# Patient Record
Sex: Male | Born: 1955
Health system: Southern US, Community
[De-identification: ages and names within clinical notes are randomized; demographics above are authoritative.]

## PROBLEM LIST (undated history)

## (undated) DIAGNOSIS — N400 Enlarged prostate without lower urinary tract symptoms: Secondary | ICD-10-CM

## (undated) DIAGNOSIS — M47812 Spondylosis without myelopathy or radiculopathy, cervical region: Secondary | ICD-10-CM

## (undated) DIAGNOSIS — M48061 Spinal stenosis, lumbar region without neurogenic claudication: Secondary | ICD-10-CM

## (undated) DIAGNOSIS — R339 Retention of urine, unspecified: Secondary | ICD-10-CM

## (undated) DIAGNOSIS — R351 Nocturia: Secondary | ICD-10-CM

## (undated) DIAGNOSIS — M1990 Unspecified osteoarthritis, unspecified site: Secondary | ICD-10-CM

## (undated) HISTORY — PX: INGUINAL HERNIA REPAIR: SUR1180

## (undated) HISTORY — PX: OTHER SURGICAL HISTORY: SHX169

## (undated) HISTORY — DX: Unspecified osteoarthritis, unspecified site: M19.90

---

## 1998-12-01 ENCOUNTER — Encounter: Admission: RE | Admit: 1998-12-01 | Discharge: 1998-12-01 | Payer: Self-pay | Admitting: Sports Medicine

## 2000-01-28 ENCOUNTER — Ambulatory Visit (HOSPITAL_COMMUNITY): Admission: RE | Admit: 2000-01-28 | Discharge: 2000-01-28 | Payer: Self-pay | Admitting: Gastroenterology

## 2001-08-15 ENCOUNTER — Encounter: Admission: RE | Admit: 2001-08-15 | Discharge: 2001-08-15 | Payer: Self-pay | Admitting: Family Medicine

## 2003-08-09 ENCOUNTER — Ambulatory Visit (HOSPITAL_COMMUNITY): Admission: RE | Admit: 2003-08-09 | Discharge: 2003-08-09 | Payer: Self-pay | Admitting: Gastroenterology

## 2004-05-29 ENCOUNTER — Encounter: Admission: RE | Admit: 2004-05-29 | Discharge: 2004-05-29 | Payer: Self-pay | Admitting: Family Medicine

## 2004-06-02 ENCOUNTER — Encounter: Admission: RE | Admit: 2004-06-02 | Discharge: 2004-06-02 | Payer: Self-pay | Admitting: Sports Medicine

## 2004-06-03 ENCOUNTER — Encounter: Admission: RE | Admit: 2004-06-03 | Discharge: 2004-06-03 | Payer: Self-pay | Admitting: Sports Medicine

## 2004-06-05 ENCOUNTER — Encounter: Admission: RE | Admit: 2004-06-05 | Discharge: 2004-06-05 | Payer: Self-pay | Admitting: Family Medicine

## 2004-06-12 ENCOUNTER — Ambulatory Visit: Payer: Self-pay | Admitting: Sports Medicine

## 2004-06-19 ENCOUNTER — Ambulatory Visit: Payer: Self-pay | Admitting: Sports Medicine

## 2004-07-03 ENCOUNTER — Ambulatory Visit: Payer: Self-pay | Admitting: Sports Medicine

## 2004-07-21 ENCOUNTER — Encounter: Admission: RE | Admit: 2004-07-21 | Discharge: 2004-07-21 | Payer: Self-pay | Admitting: Sports Medicine

## 2004-07-21 ENCOUNTER — Ambulatory Visit: Payer: Self-pay | Admitting: Sports Medicine

## 2004-07-28 ENCOUNTER — Ambulatory Visit: Payer: Self-pay | Admitting: Family Medicine

## 2005-11-09 ENCOUNTER — Ambulatory Visit: Payer: Self-pay | Admitting: Sports Medicine

## 2005-11-11 ENCOUNTER — Encounter: Admission: RE | Admit: 2005-11-11 | Discharge: 2005-11-11 | Payer: Self-pay | Admitting: Sports Medicine

## 2006-01-25 ENCOUNTER — Emergency Department (HOSPITAL_COMMUNITY): Admission: EM | Admit: 2006-01-25 | Discharge: 2006-01-25 | Payer: Self-pay | Admitting: Family Medicine

## 2006-03-08 ENCOUNTER — Ambulatory Visit: Payer: Self-pay | Admitting: Sports Medicine

## 2006-03-10 ENCOUNTER — Encounter: Admission: RE | Admit: 2006-03-10 | Discharge: 2006-03-10 | Payer: Self-pay | Admitting: Sports Medicine

## 2006-06-24 ENCOUNTER — Ambulatory Visit: Payer: Self-pay | Admitting: Sports Medicine

## 2006-11-02 ENCOUNTER — Ambulatory Visit: Payer: Self-pay | Admitting: Sports Medicine

## 2006-12-16 ENCOUNTER — Ambulatory Visit: Payer: Self-pay | Admitting: Sports Medicine

## 2006-12-16 DIAGNOSIS — M5136 Other intervertebral disc degeneration, lumbar region: Secondary | ICD-10-CM | POA: Insufficient documentation

## 2006-12-16 DIAGNOSIS — M549 Dorsalgia, unspecified: Secondary | ICD-10-CM | POA: Insufficient documentation

## 2006-12-16 DIAGNOSIS — M5126 Other intervertebral disc displacement, lumbar region: Secondary | ICD-10-CM

## 2007-01-20 ENCOUNTER — Ambulatory Visit: Payer: Self-pay | Admitting: Sports Medicine

## 2007-01-20 DIAGNOSIS — S93439A Sprain of tibiofibular ligament of unspecified ankle, initial encounter: Secondary | ICD-10-CM | POA: Insufficient documentation

## 2007-02-21 ENCOUNTER — Ambulatory Visit: Payer: Self-pay | Admitting: Sports Medicine

## 2007-03-21 ENCOUNTER — Ambulatory Visit: Payer: Self-pay | Admitting: Sports Medicine

## 2007-03-24 ENCOUNTER — Encounter: Admission: RE | Admit: 2007-03-24 | Discharge: 2007-03-24 | Payer: Self-pay | Admitting: Sports Medicine

## 2007-03-24 ENCOUNTER — Encounter (INDEPENDENT_AMBULATORY_CARE_PROVIDER_SITE_OTHER): Payer: Self-pay | Admitting: *Deleted

## 2007-04-03 ENCOUNTER — Encounter: Payer: Self-pay | Admitting: Sports Medicine

## 2007-04-10 ENCOUNTER — Encounter (INDEPENDENT_AMBULATORY_CARE_PROVIDER_SITE_OTHER): Payer: Self-pay | Admitting: *Deleted

## 2007-05-17 ENCOUNTER — Encounter: Payer: Self-pay | Admitting: Sports Medicine

## 2007-06-28 ENCOUNTER — Encounter (INDEPENDENT_AMBULATORY_CARE_PROVIDER_SITE_OTHER): Payer: Self-pay | Admitting: *Deleted

## 2007-12-05 ENCOUNTER — Encounter (INDEPENDENT_AMBULATORY_CARE_PROVIDER_SITE_OTHER): Payer: Self-pay | Admitting: *Deleted

## 2008-10-29 ENCOUNTER — Ambulatory Visit: Payer: Self-pay | Admitting: Sports Medicine

## 2008-10-29 DIAGNOSIS — M25519 Pain in unspecified shoulder: Secondary | ICD-10-CM | POA: Insufficient documentation

## 2008-10-29 DIAGNOSIS — M25569 Pain in unspecified knee: Secondary | ICD-10-CM | POA: Insufficient documentation

## 2009-03-19 ENCOUNTER — Ambulatory Visit: Payer: Self-pay | Admitting: Sports Medicine

## 2009-03-19 DIAGNOSIS — IMO0002 Reserved for concepts with insufficient information to code with codable children: Secondary | ICD-10-CM | POA: Insufficient documentation

## 2010-12-30 ENCOUNTER — Other Ambulatory Visit: Payer: Self-pay | Admitting: Sports Medicine

## 2010-12-30 DIAGNOSIS — M549 Dorsalgia, unspecified: Secondary | ICD-10-CM

## 2011-02-23 ENCOUNTER — Other Ambulatory Visit: Payer: Self-pay | Admitting: Sports Medicine

## 2011-04-17 ENCOUNTER — Other Ambulatory Visit: Payer: Self-pay | Admitting: Sports Medicine

## 2011-05-13 ENCOUNTER — Ambulatory Visit (INDEPENDENT_AMBULATORY_CARE_PROVIDER_SITE_OTHER): Payer: 59 | Admitting: Sports Medicine

## 2011-05-13 ENCOUNTER — Encounter: Payer: Self-pay | Admitting: Sports Medicine

## 2011-05-13 VITALS — BP 115/71 | HR 42 | Ht 67.0 in | Wt 150.0 lb

## 2011-05-13 DIAGNOSIS — M5137 Other intervertebral disc degeneration, lumbosacral region: Secondary | ICD-10-CM

## 2011-05-13 DIAGNOSIS — S76319A Strain of muscle, fascia and tendon of the posterior muscle group at thigh level, unspecified thigh, initial encounter: Secondary | ICD-10-CM | POA: Insufficient documentation

## 2011-05-13 DIAGNOSIS — IMO0002 Reserved for concepts with insufficient information to code with codable children: Secondary | ICD-10-CM

## 2011-05-13 MED ORDER — GABAPENTIN 600 MG PO TABS: 600.0000 mg | ORAL_TABLET | Freq: Two times a day (BID) | ORAL | Status: DC

## 2011-05-13 NOTE — Assessment & Plan Note (Signed)
History seems to be good so I think he should continue to wear a compression sleeve. He can continue to apply but be careful with any sprinting.  A series of hamstring exercises and also some for his hips which are very tight to try to prevent this from turning into more significant injury.

## 2011-05-13 NOTE — Patient Instructions (Signed)
Do pretzel stretch 2-3 times for 30 seconds daily

## 2011-05-13 NOTE — Assessment & Plan Note (Signed)
He has very limited mobility of his lumbosacral spine. Based on the degree of degenerative change in his lumbar MRI I suspect he will need to continue taking the gabapentin. He can adjust the dose if he is doing well but can take from 600 daily up to 3 times a day if needed  Recheck this with me yearly.

## 2011-05-13 NOTE — Progress Notes (Signed)
  Subjective:    Patient ID: George Ramos, male    DOB: 03/28/1956, 55 y.o.   MRN: [id number]  HPI  Pt presents to clinic today for f/u for gabapentin which he takes for ruptures disc.  States he stopped the medication 1-2 years ago, and the back pain returned, restarted the med and pain subsided.  Currently taking gabapentin 600 mg at bedtime. States his back ROM has improved, and he is able to sleep well while on the gabapentin.  Plays tennis several times per week.  Takes 600 mg ibuprofen before playing tennis.   Has Lt HS pain for 4-6 weeks that he felt during a tennis match.  Took 1 week off of tennis, but continues to have HS pain when he plays tennis.    Review of Systems     Objective:   Physical Exam  20 degrees back extension and is more comfortable with bending knees Forward flexion gets 80 degrees Lateral bend- all out of thoracic spine bilaterally- lumbar spine does not move Rotation bilaterally slightly diminished but improved 70 degrees right straight leg without problems 55 degrees lt straight leg w/out problems FABER normal on rt, but slightly tight FABER tight on lt- 10-15 deg limitation Lt SI joint slightly tight to movement  Good HS strength with foot IR, ER and neutral        Assessment & Plan:

## 2011-09-15 ENCOUNTER — Ambulatory Visit (INDEPENDENT_AMBULATORY_CARE_PROVIDER_SITE_OTHER): Payer: 59 | Admitting: Sports Medicine

## 2011-09-15 ENCOUNTER — Ambulatory Visit
Admission: RE | Admit: 2011-09-15 | Discharge: 2011-09-15 | Disposition: A | Discharge status: Home / Self Care | Payer: 59 | Source: Ambulatory Visit | Attending: Sports Medicine | Admitting: Sports Medicine

## 2011-09-15 VITALS — BP 110/60

## 2011-09-15 DIAGNOSIS — M25519 Pain in unspecified shoulder: Secondary | ICD-10-CM

## 2011-09-15 DIAGNOSIS — M5137 Other intervertebral disc degeneration, lumbosacral region: Secondary | ICD-10-CM

## 2011-09-15 DIAGNOSIS — M542 Cervicalgia: Secondary | ICD-10-CM

## 2011-09-15 MED ORDER — KETOPROFEN POWD: Status: DC

## 2011-09-15 MED ORDER — TRAMADOL HCL 50 MG PO TABS: 50.0000 mg | ORAL_TABLET | Freq: Two times a day (BID) | ORAL | Status: AC | PRN

## 2011-09-15 NOTE — Assessment & Plan Note (Signed)
I am concerned that this might be coming from his neck with some nerve root impingement Start some tramadol for pain relief Continue the gabapentin  Check cervical spine x-rays  If Cervical spine does not look to be the problem we should bring him back in 2 scan his rotator cuff

## 2011-09-15 NOTE — Assessment & Plan Note (Signed)
Continue on gabapentin as this seems to be helping his symptoms He has not been using any tramadol and seems to get enough relief with ibuprofen and Tylenol for low back

## 2011-09-15 NOTE — Patient Instructions (Addendum)
Please go to [facility] for your neck x-ray  Do easy range of motion for your neck a couple of times per day  Do suggested exercises for your shoulder daily, and shake out exercises  Try topical ketoprofen gel on your rt index finger 3-4 times per day  Try tramadol at dinner time, and you may take another one before bed if you are having pain  Please follow up in 6 weeks   Thank you for seeing [place] today!

## 2011-09-15 NOTE — Progress Notes (Signed)
  Subjective:    Patient ID: George Ramos, male    DOB: [birth year], 55 y.o.   MRN: [id number]  HPI  Pt presents to clinic for Rt shoulder pain. Does not recall a specific injury.  Wakes him at night especially with IR positions. Does a lot of energy assessments for work, crawling under houses, putting shoulder in painful positions.  Has shoulder pain after these assessments.  Now unable to serve in tennis without pain. Takes gabapentin 600 mg qhs for low back, which has been helpful.  Has old [employer] fx.  He played tennis term is through October without a lot of shoulder pain. He is actually been resting more the past 6 weeks and has been an increase in the shoulder symptom.  Has right 2nd finger tenderness that has been improving over the past 2 months.    Review of Systems     Objective:   Physical Exam No acute distress  Neck Full extension, flexion, full rt and lt lateral rotation but feels some discomfort  Lt lateral bend 30 deg, 15 deg rt  Rt shoulder sits slight high, arm slightly longer Back is not asymmetric   Rt shoulder exam: Has pain at 120 deg elevation on rt Slight scapular asynchrony Bicipital grooves non tender Speeds causes slight discomfort on rt Abduction at waist level no pain Elevation 120 deg strong Empty can neg Resisted IR and ER strong at waist level Hawkins slight painful, but not weak Neer's is painful Cross over slightly painful O'brein's painful, but not with thumb down ER at 90 deg not painful Push off test neg  Rt 2nd finger- arthritic change at MCP         Assessment & Plan:

## 2011-09-16 ENCOUNTER — Telehealth: Payer: Self-pay | Admitting: *Deleted

## 2011-09-16 NOTE — Telephone Encounter (Signed)
Spoke with pt- advised per dr. Darrick Penna his c-spine x-ray showed some arthritis, and he thinks this is the cause of his shoulder pain due to a nerve impingement.  Dr. Darrick Penna recommends taking tramadol for pain, if this does not control symptoms- increase gabapentin to 1/2 tab in the morning and 1 tab qhs of the 600 mg tablets.  Pt agreeable.

## 2011-10-27 ENCOUNTER — Ambulatory Visit (INDEPENDENT_AMBULATORY_CARE_PROVIDER_SITE_OTHER): Payer: BC Managed Care – PPO | Admitting: Sports Medicine

## 2011-10-27 VITALS — BP 122/64

## 2011-10-27 DIAGNOSIS — M25519 Pain in unspecified shoulder: Secondary | ICD-10-CM

## 2011-10-27 DIAGNOSIS — M47812 Spondylosis without myelopathy or radiculopathy, cervical region: Secondary | ICD-10-CM

## 2011-10-27 NOTE — Assessment & Plan Note (Addendum)
Evident on neck films from 09/15/11.  Probably primary etiology of shoulder pain.   We will probably need to continue long term gabapentin and tramadol  Reck in 2 mos pending response

## 2011-10-27 NOTE — Patient Instructions (Addendum)
When you can tolerate the gabapentin at your current dose, increase to one tablet in the morning and one in the evening.  Continue tramadol, one in morning one in the evening.   Please start doing the following exercises (see attached sheet) one daily, 3 sets of 15 with a light weight.    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

## 2011-10-27 NOTE — Progress Notes (Signed)
  Subjective:    Patient ID: George Ramos, male    DOB: [birth year], 56 y.o.   MRN: [id number]  HPI  Babe comes in for followup of his right neck and shoulder pain. He says he is sleeping a little bit better, but his shoulder still bothers him quite a bit at night when he tries to sleep on his right side. He says he has not played much tennis since. October.  He tried doing home exercises, but felt like they were pinching and irritating something in his shoulder, so he stopped doing them.   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    He is taking gabapentin 300 in the morning and 600 in the evening, and tramadol 50 twice a day. This regimen did end the day pain and would be tolerable level x for tennis.  Night pain is almost always positional.  Review of Systems Pertinent items noted in HPI.     Objective:   Physical Exam BP 122/64 General appearance: alert, cooperative and no distress MSK: Neck: patient has decreased ROM looking laterally, R>L Decreased ROM with shoulder to ear, R>L  Shoulder: No deformity or tenderness to palpation bilaterally Right shoulder with slight pain with hawkins Normal strength with rotator cuff testing.   Back: Pt has winging of right scapula compared to left, at rest right scapula sits higher and it wings out with ROM.   MSK [place] of Right Shoulder:  AC joint appears normal with no evidence of arthritic changes Biceps tendon in tact, no abnormalities Infraspinatus, supraspinatus, and teres minor in tact without abnormalities. Subscapularis is intact but there is some hypoechogenic areas inferior surface indicative of swelling.  No tears noted      Assessment & Plan:

## 2011-10-27 NOTE — Assessment & Plan Note (Signed)
Patient has asymetry and winging of right scapula compared to left, with no obvious rotator cuff injury.  Concerning for nerve root impingement (particularly considering x-rays that show spurring in the c6-7 spaces). Will increase gabapentin and give home exercise program aimed at correcting winging of scapula.  See pt instructions.

## 2011-11-30 ENCOUNTER — Other Ambulatory Visit: Payer: Self-pay | Admitting: Gastroenterology

## 2011-12-27 ENCOUNTER — Ambulatory Visit (INDEPENDENT_AMBULATORY_CARE_PROVIDER_SITE_OTHER): Payer: BC Managed Care – PPO | Admitting: Sports Medicine

## 2011-12-27 VITALS — BP 120/60

## 2011-12-27 DIAGNOSIS — M25519 Pain in unspecified shoulder: Secondary | ICD-10-CM

## 2011-12-27 MED ORDER — GABAPENTIN 600 MG PO TABS: ORAL_TABLET | ORAL | Status: DC

## 2011-12-27 MED ORDER — TRAMADOL HCL 50 MG PO TABS: 50.0000 mg | ORAL_TABLET | Freq: Four times a day (QID) | ORAL | Status: AC | PRN

## 2011-12-27 NOTE — Assessment & Plan Note (Signed)
His pain is improved with HEP, gabapentin, and tramadol.  We will continue his HEP and start to wean the gabapentin.  We will hope to continue to wean his meds in upcoming visits.

## 2011-12-27 NOTE — Patient Instructions (Signed)
1. Take one gabapentin at night and a half one in the daytime.   Continue taking your twice daily tramadol.   2. Follow up in 3 months or sooner for problems.

## 2011-12-27 NOTE — Progress Notes (Signed)
  Subjective:    Patient ID: George Ramos, male    DOB: Feb 01, 1956, 56 y.o.   MRN: [id number]  HPI 56 y/o male is here for follow up for right shoulder pain.  He only had the pain with his tennis game and while at work.  He continues to have the pain occasionally, especially with his serve but it is much less in intensity.  He is taking gabapentin 600mg , one tab in the morning and one at night.  He is also taking 2 doses of tramadol daily, one before activity and one at night.  He has no neck pain at all.   Review of Systems     Objective:   Physical Exam  Right Shoulder: Inspection reveals no abnormalities or atrophy.  The right side is somewhat more developed than the left but there is no gross asymmetry. Palpation is normal with no tenderness over AC joint or bicipital groove. ROM is essentially full in all planes.   Rotator cuff strength normal throughout. No painful arc and no drop arm sign. No signs of impingement with negative Neer and Hawkin's tests, empty can. Speeds and Yergason's tests normal. No labral pathology noted with negative Obrien's. Right scapula has some degree of winging after 4 arm raises but much less than on previous exam.  Neck: Full range of motion         Assessment & Plan:

## 2012-03-13 ENCOUNTER — Other Ambulatory Visit: Payer: Self-pay | Admitting: *Deleted

## 2012-03-13 MED ORDER — TRAMADOL HCL 50 MG PO TABS: 50.0000 mg | ORAL_TABLET | Freq: Four times a day (QID) | ORAL | Status: AC | PRN

## 2012-03-28 ENCOUNTER — Encounter: Payer: Self-pay | Admitting: Sports Medicine

## 2012-03-28 ENCOUNTER — Ambulatory Visit (INDEPENDENT_AMBULATORY_CARE_PROVIDER_SITE_OTHER): Payer: BC Managed Care – PPO | Admitting: Sports Medicine

## 2012-03-28 VITALS — BP 116/73 | HR 51 | Ht 67.0 in | Wt 150.0 lb

## 2012-03-28 DIAGNOSIS — M79609 Pain in unspecified limb: Secondary | ICD-10-CM

## 2012-03-28 DIAGNOSIS — M79671 Pain in right foot: Secondary | ICD-10-CM | POA: Insufficient documentation

## 2012-03-28 DIAGNOSIS — M47812 Spondylosis without myelopathy or radiculopathy, cervical region: Secondary | ICD-10-CM

## 2012-03-28 NOTE — Assessment & Plan Note (Addendum)
Most likely metatarsal pain 2/2 transverse arch collapse. Pt given green OTC orthotic with metatarsal pad for support of transverse arch to see if this helps with discomfort.  Pt to return as needed.   He was able to walk with the sports insole and the metatarsal pads with less pain. We will test this for one month and then have him continue if working.

## 2012-03-28 NOTE — Assessment & Plan Note (Addendum)
Pt to continue rehab exercises.  Continue gabapentin and tramadol as directed.  Return as needed for recheck or if new or worsening of symptoms.   He will progresses tennis activities and continue to maintain his shoulder strength

## 2012-03-28 NOTE — Progress Notes (Signed)
  Subjective:    Patient ID: George Ramos, male    DOB: Oct 18, 1955, 56 y.o.   MRN: [id number]  HPI Right foot pain: Daily x 1-2 months, pain in bottom front of foot and base of 3rd and 4th toes.  Wearing works boots makes it worse.  Playing tennis, walking barefoot makes it worse.  Wearing tennis shoes makes it better.  No redness of foot. No rash.   Right shoulder/neck pain f/up: 75% improved compared to last visit. Pain in neck/shoulder thought to be 2/2 cervical spine degeneration. Now Often painfree.  Pain sometimes occurs at night- pain just "annoying"- pain of 2/10 on painscale.  Previously was 7/10.   Pain very positional at night.  Pain well controlled with neurotin 900 at bedtimes.  And occasional neurotin 600mg  at noon.  Takes tramadol most days at noon.  No numbness/tingling in arm.  No pain in neck and shoulder at time of exam.  No weakness in arms.      Review of Systems    as per above Objective:   Physical Exam  Constitutional: He appears well-developed.  Cardiovascular: Normal rate.   Pulmonary/Chest: Effort normal. No respiratory distress.  Musculoskeletal:       Right Shoulder: Inspection reveals no abnormalities or atrophy.  no gross asymmetry. Palpation is normal with no tenderness over AC joint or bicipital groove. ROM is  full in all planes.   Rotator cuff strength normal throughout. No painful arc and no drop arm sign. No signs of impingement with negative Neer and Hawkin's tests, empty can. No winging of scapula as seen on previous exam  Neck: Full range of motion. No tenderness to palpation.    Right foot: + transverse arch collapse.  + point tenderness on plantar surface of foot at base of 3rd and 4th toes.            Assessment & Plan:

## 2012-05-22 ENCOUNTER — Other Ambulatory Visit: Payer: Self-pay | Admitting: *Deleted

## 2012-05-22 MED ORDER — GABAPENTIN 600 MG PO TABS: ORAL_TABLET | ORAL | Status: DC

## 2012-07-13 ENCOUNTER — Encounter: Payer: Self-pay | Admitting: Sports Medicine

## 2012-07-13 ENCOUNTER — Ambulatory Visit (INDEPENDENT_AMBULATORY_CARE_PROVIDER_SITE_OTHER): Payer: BC Managed Care – PPO | Admitting: Sports Medicine

## 2012-07-13 VITALS — BP 114/67 | HR 56 | Ht 67.0 in | Wt 150.0 lb

## 2012-07-13 DIAGNOSIS — R269 Unspecified abnormalities of gait and mobility: Secondary | ICD-10-CM

## 2012-07-13 DIAGNOSIS — M79609 Pain in unspecified limb: Secondary | ICD-10-CM

## 2012-07-13 DIAGNOSIS — M79671 Pain in right foot: Secondary | ICD-10-CM

## 2012-07-13 NOTE — Assessment & Plan Note (Signed)
Patient's running gait was more comfortable and less limp is present osseous place and orthotics  He will tennis these over the next month and we'll make any adjustments before he plays tournaments  If doing well we will recheck when necessary

## 2012-07-13 NOTE — Assessment & Plan Note (Addendum)
Patient was fitted for a : standard, cushioned, semi-rigid orthotic. The orthotic was heated and afterward the patient stood on the orthotic blank positioned on the orthotic stand. The patient was positioned in subtalar neutral position and 10 degrees of ankle dorsiflexion in a weight bearing stance. After completion of molding, a stable base was applied to the orthotic blank. The blank was ground to a stable position for weight bearing. Size: 11 Base: Red Posting: Blue EVA Additional orthotic padding: small MT pad on the rt orthotic with 1st ray post with blue foam  Patient was comfortable and walking gait neutral with orthotics.  Preparation time is 40 minutes

## 2012-07-13 NOTE — Progress Notes (Signed)
Patient ID: George Ramos, male   DOB: [birth year], 55 y.o.   MRN: [id number]  Patient is a competitive Armed forces operational officer. We placed him in metatarsal pads for chronic right foot pain. A metatarsal pad did allow him to return to playing tennis. However the metatarsal pads feel uncomfortable in regular shoes. He is starting to play more tennis and is wearing through his sports insoles. He comes to consider custom orthotics as he wants to play in the state tournaments and feels that in his regular tennis shoes and sports insoles he gets too much pain to be able to complete a tournament.  Physical examination The right foot shows a drop in the transverse arch over the metatarsals 3 and 4 Metatarsal head 3 is moderately tender and metatarsal head for is mildly tender Metatarsals one 2 and 5 her nontender He still has a moderately preserved longitudinal arch  Running gait reveals a foer foot strike with some external rotation of the feet bilaterally

## 2012-11-29 ENCOUNTER — Ambulatory Visit: Payer: BC Managed Care – PPO | Admitting: Sports Medicine

## 2012-12-19 ENCOUNTER — Ambulatory Visit (INDEPENDENT_AMBULATORY_CARE_PROVIDER_SITE_OTHER): Payer: BC Managed Care – PPO | Admitting: Sports Medicine

## 2012-12-19 ENCOUNTER — Encounter: Payer: Self-pay | Admitting: Sports Medicine

## 2012-12-19 VITALS — BP 130/70 | Ht 67.0 in | Wt 150.0 lb

## 2012-12-19 DIAGNOSIS — M7710 Lateral epicondylitis, unspecified elbow: Secondary | ICD-10-CM

## 2012-12-19 DIAGNOSIS — M7711 Lateral epicondylitis, right elbow: Secondary | ICD-10-CM | POA: Insufficient documentation

## 2012-12-19 MED ORDER — NITROGLYCERIN 0.2 MG/HR TD PT24: MEDICATED_PATCH | TRANSDERMAL | Status: DC

## 2012-12-19 NOTE — Assessment & Plan Note (Signed)
See plan in progress note  Good candidate for home rehab and NTG protocol

## 2012-12-19 NOTE — Progress Notes (Signed)
Patient ID: George Ramos, male   DOB: [birth year], 56 y.o.   MRN: [id number]  Follow up for    Subjective:  George Ramos is a R hand dominant 57 year old Caucasian male presenting to clinic for 7 month history of R lateral epicondyle pain that began when playing tennis, reaching his forehand back to hit a ball.  Managed pain initially with ice and decreasing tennis playing (once a week) and then received corticosteroid shot 6 months ago (October) however only lasted 1.5 weeks and was unable to compete in state tennis tournament due to pain.  Decreased tennis further over the last 2 months, attempted exercises (wrist flexion/extension and supination/pronation) that aggravated with return of pain.  Attempted to play tennis 2 weeks ago and continues to have pain.  Pain seems to worsen especially with supination (pulling covers off, using a screwdriver) and hitting a tennis ball with backhand. Taking Gabapentin 600 mg at bedtime for back pain and occasional Ibuprofen for pain with no relief. History of similar issues about 2-3 years ago that improved with rest.  No history of migraines.  Denies weakness, numbness, tingling in fingers.   Objective: BP 130/70  Ht 5\' 7"  (1.702 m)  Wt 150 lb (68.04 kg)  BMI 23.49 kg/m2 GEN:  Well appearing, fit elderly man sitting on examining table in no acute distress.   MSK: R elbow and forearm:  No obvious deformities or swelling.  Point tenderness to R lateral epicondyle with active contraction of forearm muscles, non tender when relaxed.  Has full passive and active ROM of R elbow and wrist, flexion/extension and supination/pronation.  5/5 strength with elbow flexion/extension and supination/pronation with localized mild tenderness at R lateral epicondyle. 2+ radial pulses bilaterally.  Full sensation to forearm.   Book test +  U/S with Doppler of R lateral epicondyle: 2 bone spurs at lat epicondyle seen with splitting of tendon.  Increased vascularity seen  directly overlaying bone spur as well as along the extensor muscle insertion at the lat epicondyle.  Fluid collection also along the muscle bed.  Large amount of neovascular activity in main belly of tendon.  Assessment and Plan: George Ramos is a 57 year old R hand dominant male presenting for management for R lateral epicondylitis.  U/S significant for bone spurs, increased vascularity, and edema likely evidence of increased inflammation due epicondylitis.  Would likely benefit from nitroglycerin protocol.     1. Will start on regimen including compressive elbow sleeve, exercises (ball squeeze, wrist extension, and elbow supination) targeting extensor muscle strengthening, and nitroglycerin 1/4 patch daily for 6 weeks. 2. Encouraged to restart tennis playing however limiting mostly to drills (no competitive play) and watching backhand use. 3. Will follow up in 6 weeks.   Walden Field, MD [facility] PGY-1 12/19/2012 10:07 AM

## 2013-08-05 ENCOUNTER — Other Ambulatory Visit: Payer: Self-pay | Admitting: Sports Medicine

## 2013-08-21 ENCOUNTER — Observation Stay (HOSPITAL_COMMUNITY)
Admission: EM | Admit: 2013-08-21 | Discharge: 2013-08-22 | Disposition: A | Discharge status: Home / Self Care | Payer: BC Managed Care – PPO | Attending: Urology | Admitting: Urology

## 2013-08-21 ENCOUNTER — Encounter (HOSPITAL_COMMUNITY): Payer: Self-pay | Admitting: Emergency Medicine

## 2013-08-21 ENCOUNTER — Emergency Department (HOSPITAL_COMMUNITY): Payer: BC Managed Care – PPO

## 2013-08-21 DIAGNOSIS — K921 Melena: Secondary | ICD-10-CM | POA: Insufficient documentation

## 2013-08-21 DIAGNOSIS — N400 Enlarged prostate without lower urinary tract symptoms: Secondary | ICD-10-CM | POA: Insufficient documentation

## 2013-08-21 DIAGNOSIS — K625 Hemorrhage of anus and rectum: Secondary | ICD-10-CM

## 2013-08-21 DIAGNOSIS — W1811XA Fall from or off toilet without subsequent striking against object, initial encounter: Secondary | ICD-10-CM | POA: Insufficient documentation

## 2013-08-21 DIAGNOSIS — I959 Hypotension, unspecified: Secondary | ICD-10-CM | POA: Insufficient documentation

## 2013-08-21 DIAGNOSIS — S0003XA Contusion of scalp, initial encounter: Secondary | ICD-10-CM | POA: Insufficient documentation

## 2013-08-21 DIAGNOSIS — R55 Syncope and collapse: Principal | ICD-10-CM | POA: Insufficient documentation

## 2013-08-21 DIAGNOSIS — M47812 Spondylosis without myelopathy or radiculopathy, cervical region: Secondary | ICD-10-CM | POA: Insufficient documentation

## 2013-08-21 LAB — PROTIME-INR
INR: 1.14 (ref 0.00–1.49)
Prothrombin Time: 14.4 seconds (ref 11.6–15.2)

## 2013-08-21 LAB — CBC
HCT: 38.2 % — ABNORMAL LOW (ref 39.0–52.0)
Hemoglobin: 13.4 g/dL (ref 13.0–17.0)
MCH: 29.8 pg (ref 26.0–34.0)
MCHC: 35.1 g/dL (ref 30.0–36.0)
MCV: 85.1 fL (ref 78.0–100.0)
Platelets: 182 10*3/uL (ref 150–400)
RBC: 4.49 MIL/uL (ref 4.22–5.81)
RDW: 12.8 % (ref 11.5–15.5)
WBC: 11.3 10*3/uL — ABNORMAL HIGH (ref 4.0–10.5)

## 2013-08-21 LAB — URINALYSIS, ROUTINE W REFLEX MICROSCOPIC
Ketones, ur: NEGATIVE mg/dL
Nitrite: NEGATIVE
Protein, ur: 30 mg/dL — AB
Urobilinogen, UA: 0.2 mg/dL (ref 0.0–1.0)
pH: 6 (ref 5.0–8.0)

## 2013-08-21 LAB — COMPREHENSIVE METABOLIC PANEL
ALT: 15 U/L (ref 0–53)
AST: 16 U/L (ref 0–37)
Albumin: 3.3 g/dL — ABNORMAL LOW (ref 3.5–5.2)
Alkaline Phosphatase: 64 U/L (ref 39–117)
BUN: 16 mg/dL (ref 6–23)
CO2: 23 mEq/L (ref 19–32)
Calcium: 8.1 mg/dL — ABNORMAL LOW (ref 8.4–10.5)
Chloride: 102 mEq/L (ref 96–112)
Creatinine, Ser: 1.08 mg/dL (ref 0.50–1.35)
GFR calc Af Amer: 87 mL/min — ABNORMAL LOW (ref >90)
GFR calc non Af Amer: 75 mL/min — ABNORMAL LOW (ref >90)
Glucose, Bld: 141 mg/dL — ABNORMAL HIGH (ref 70–99)
Potassium: 4.6 mEq/L (ref 3.5–5.1)
Sodium: 133 mEq/L — ABNORMAL LOW (ref 135–145)
Total Bilirubin: 0.8 mg/dL (ref 0.3–1.2)
Total Protein: 5.6 g/dL — ABNORMAL LOW (ref 6.0–8.3)

## 2013-08-21 LAB — APTT: aPTT: 25 seconds (ref 24–37)

## 2013-08-21 LAB — TYPE AND SCREEN: ABO/RH(D): AB POS

## 2013-08-21 LAB — OCCULT BLOOD, POC DEVICE: Fecal Occult Bld: POSITIVE — AB

## 2013-08-21 LAB — URINE MICROSCOPIC-ADD ON

## 2013-08-21 MED ORDER — SODIUM CHLORIDE 0.9 % IV BOLUS (SEPSIS)
1000.0000 mL | Freq: Once | INTRAVENOUS | Status: AC
  Administered 2013-08-21: 1000 mL via INTRAVENOUS

## 2013-08-21 MED ORDER — OXYCODONE HCL 5 MG PO TABS: 5.0000 mg | ORAL_TABLET | ORAL | Status: DC | PRN

## 2013-08-21 MED ORDER — ONDANSETRON HCL 4 MG/2ML IJ SOLN: 4.0000 mg | INTRAMUSCULAR | Status: DC | PRN

## 2013-08-21 MED ORDER — KCL IN DEXTROSE-NACL 20-5-0.45 MEQ/L-%-% IV SOLN
INTRAVENOUS | Status: DC
  Filled 2013-08-21 (×2): qty 1000

## 2013-08-21 MED ORDER — GABAPENTIN 300 MG PO CAPS
600.0000 mg | ORAL_CAPSULE | Freq: Every day | ORAL | Status: DC
  Administered 2013-08-21: 600 mg via ORAL
  Filled 2013-08-21 (×2): qty 2

## 2013-08-21 MED ORDER — DOCUSATE SODIUM 100 MG PO CAPS
100.0000 mg | ORAL_CAPSULE | Freq: Two times a day (BID) | ORAL | Status: DC
  Administered 2013-08-21 – 2013-08-22 (×2): 100 mg via ORAL
  Filled 2013-08-21 (×3): qty 1

## 2013-08-21 MED ORDER — SULFAMETHOXAZOLE-TMP DS 800-160 MG PO TABS
2.0000 | ORAL_TABLET | Freq: Two times a day (BID) | ORAL | Status: DC
  Filled 2013-08-21: qty 2

## 2013-08-21 MED ORDER — SULFAMETHOXAZOLE-TMP DS 800-160 MG PO TABS
1.0000 | ORAL_TABLET | Freq: Two times a day (BID) | ORAL | Status: DC
  Administered 2013-08-21 – 2013-08-22 (×2): 1 via ORAL
  Filled 2013-08-21 (×3): qty 1

## 2013-08-21 NOTE — ED Notes (Signed)
Pt reports that his normal resting HR is 42-55

## 2013-08-21 NOTE — ED Notes (Signed)
Arrives via [facility] from home, had prostate biopsy this AM with prolonged bleeding, had syncopal episode in bathroom with fall, 2 hematomas noted to forehead, is A/O on arrival, EMS BP 80 systolic, 122/67 on arrival, CBG 150, NAD

## 2013-08-21 NOTE — H&P (Signed)
George Ramos is an 57 y.o. male.    Chief Complaint: Syncope after Prostate Biopsy  HPI:  1 - Syncope after Prostate Biopsy - pt day 0 s/p uncomplicated 12 core office prostate biopsy with syncopal episode at home with fall. Had blood per rectum x 3 at home and EMS eval noted to be hypotensive to SBP80s. No fevers / chills / nausea. No h/o "easy bleeding". No recent blood thinners. No h/o refractory hemorroids.  In ER SBP >100 with nl HR and Hgb 13 w/o persistent blood per recutm. No new focal weakness. EKG sinus No h/o CV disease. No prior syncope.    Past Medical History  Diagnosis Date  . Prostate enlargement     No past surgical history on file.  No family history on file. Social History:  reports that he has never smoked. He has never used smokeless tobacco. He reports that he does not drink alcohol. His drug history is not on file.  Allergies: No Known Allergies   (Not in a hospital admission)  Results for orders placed during the hospital encounter of 08/21/13 (from the past 48 hour(s))  OCCULT BLOOD, POC DEVICE     Status: Abnormal   Collection Time    08/21/13  3:46 PM      Result Value Range   Fecal Occult Bld POSITIVE (*) NEGATIVE  CBC     Status: Abnormal   Collection Time    08/21/13  4:00 PM      Result Value Range   WBC 11.3 (*) 4.0 - 10.5 K/uL   RBC 4.49  4.22 - 5.81 MIL/uL   Hemoglobin 13.4  13.0 - 17.0 g/dL   HCT 16.1 (*) 09.6 - 04.5 %   MCV 85.1  78.0 - 100.0 fL   MCH 29.8  26.0 - 34.0 pg   MCHC 35.1  30.0 - 36.0 g/dL   RDW 40.9  81.1 - 91.4 %   Platelets 182  150 - 400 K/uL  COMPREHENSIVE METABOLIC PANEL     Status: Abnormal   Collection Time    08/21/13  4:00 PM      Result Value Range   Sodium 133 (*) 135 - 145 mEq/L   Potassium 4.6  3.5 - 5.1 mEq/L   Chloride 102  96 - 112 mEq/L   CO2 23  19 - 32 mEq/L   Glucose, Bld 141 (*) 70 - 99 mg/dL   BUN 16  6 - 23 mg/dL   Creatinine, Ser 7.82  0.50 - 1.35 mg/dL   Calcium 8.1 (*) 8.4 - 10.5 mg/dL    Total Protein 5.6 (*) 6.0 - 8.3 g/dL   Albumin 3.3 (*) 3.5 - 5.2 g/dL   AST 16  0 - 37 U/L   ALT 15  0 - 53 U/L   Alkaline Phosphatase 64  39 - 117 U/L   Total Bilirubin 0.8  0.3 - 1.2 mg/dL   GFR calc non Af Amer 75 (*) >90 mL/min   GFR calc Af Amer 87 (*) >90 mL/min   Comment: (NOTE)     The eGFR has been calculated using the CKD EPI equation.     This calculation has not been validated in all clinical situations.     eGFR's persistently <90 mL/min signify possible Chronic Kidney     Disease.  TROPONIN I     Status: None   Collection Time    08/21/13  4:00 PM      Result Value Range  Troponin I <0.30  <0.30 ng/mL   Comment:            Due to the release kinetics of cTnI,     a negative result within the first hours     of the onset of symptoms does not rule out     myocardial infarction with certainty.     If myocardial infarction is still suspected,     repeat the test at appropriate intervals.  TYPE AND SCREEN     Status: None   Collection Time    08/21/13  4:00 PM      Result Value Range   ABO/RH(D) AB POS     Antibody Screen PENDING     Sample Expiration 08/24/2013    URINALYSIS, ROUTINE W REFLEX MICROSCOPIC     Status: Abnormal   Collection Time    08/21/13  4:35 PM      Result Value Range   Color, Urine RED (*) YELLOW   Comment: BIOCHEMICALS MAY BE AFFECTED BY COLOR   APPearance TURBID (*) CLEAR   Specific Gravity, Urine 1.032 (*) 1.005 - 1.030   pH 6.0  5.0 - 8.0   Glucose, UA NEGATIVE  NEGATIVE mg/dL   Hgb urine dipstick LARGE (*) NEGATIVE   Bilirubin Urine SMALL (*) NEGATIVE   Ketones, ur NEGATIVE  NEGATIVE mg/dL   Protein, ur 30 (*) NEGATIVE mg/dL   Urobilinogen, UA 0.2  0.0 - 1.0 mg/dL   Nitrite NEGATIVE  NEGATIVE   Leukocytes, UA SMALL (*) NEGATIVE  URINE MICROSCOPIC-ADD ON     Status: None   Collection Time    08/21/13  4:35 PM      Result Value Range   WBC, UA 3-6  <3 WBC/hpf   RBC / HPF TOO NUMEROUS TO COUNT  <3 RBC/hpf   Urine-Other MUCOUS  PRESENT     Ct Head Wo Contrast  08/21/2013   CLINICAL DATA:  Syncopal episode and fall. Hematomas over the forehead. Patient had a prostate biopsy this morning.  EXAM: CT HEAD WITHOUT CONTRAST  CT CERVICAL SPINE WITHOUT CONTRAST  TECHNIQUE: Multidetector CT imaging of the head and cervical spine was performed following the standard protocol without intravenous contrast. Multiplanar CT image reconstructions of the cervical spine were also generated.  COMPARISON:  Cervical spine radiographs 09/15/2011  FINDINGS: CT HEAD FINDINGS  Bilateral frontal scalp soft tissue swelling/hematoma is evident. There is no underlying fracture. The paranasal sinuses and mastoid air cells are clear. The osseous skull is intact.  No acute cortical infarct, hemorrhage, or mass lesion is present. The ventricles are of normal size. No significant extra-axial fluid collection is present.  CT CERVICAL SPINE FINDINGS  The cervical spine is imaged from the skullbase through T2-3. Multilevel endplate degenerative changes are evident. Uncovertebral spurring is most evident on the right at C5-6 and C6-7 and on the left at C5-6. Vertebral body heights and alignment are maintained. No acute fracture or traumatic subluxation is evident. The soft tissues of the neck are unremarkable. The lung apices are clear.  IMPRESSION: 1. Normal CT appearance of the brain. 2. Bilateral frontal scalp soft tissue swelling/hematoma without underlying fracture. 3. Multilevel spondylosis of the cervical spine without evidence for acute fracture or traumatic subluxation.   Electronically Signed   By: Gennette Pac M.D.   On: 08/21/2013 16:45   Ct Cervical Spine Wo Contrast  08/21/2013   CLINICAL DATA:  Syncopal episode and fall. Hematomas over the forehead. Patient had a prostate biopsy this  morning.  EXAM: CT HEAD WITHOUT CONTRAST  CT CERVICAL SPINE WITHOUT CONTRAST  TECHNIQUE: Multidetector CT imaging of the head and cervical spine was performed following  the standard protocol without intravenous contrast. Multiplanar CT image reconstructions of the cervical spine were also generated.  COMPARISON:  Cervical spine radiographs 09/15/2011  FINDINGS: CT HEAD FINDINGS  Bilateral frontal scalp soft tissue swelling/hematoma is evident. There is no underlying fracture. The paranasal sinuses and mastoid air cells are clear. The osseous skull is intact.  No acute cortical infarct, hemorrhage, or mass lesion is present. The ventricles are of normal size. No significant extra-axial fluid collection is present.  CT CERVICAL SPINE FINDINGS  The cervical spine is imaged from the skullbase through T2-3. Multilevel endplate degenerative changes are evident. Uncovertebral spurring is most evident on the right at C5-6 and C6-7 and on the left at C5-6. Vertebral body heights and alignment are maintained. No acute fracture or traumatic subluxation is evident. The soft tissues of the neck are unremarkable. The lung apices are clear.  IMPRESSION: 1. Normal CT appearance of the brain. 2. Bilateral frontal scalp soft tissue swelling/hematoma without underlying fracture. 3. Multilevel spondylosis of the cervical spine without evidence for acute fracture or traumatic subluxation.   Electronically Signed   By: Gennette Pac M.D.   On: 08/21/2013 16:45    Review of Systems  Constitutional: Negative for fever, chills, malaise/fatigue and diaphoresis.  Eyes: Negative.  Negative for blurred vision and double vision.  Respiratory: Negative.  Negative for shortness of breath.   Cardiovascular: Negative.  Negative for chest pain and palpitations.  Gastrointestinal: Positive for blood in stool and melena. Negative for nausea, vomiting and abdominal pain.  Genitourinary: Negative.   Musculoskeletal: Negative.   Skin: Negative.   Neurological: Positive for dizziness, loss of consciousness and headaches. Negative for focal weakness and seizures.  Endo/Heme/Allergies: Negative.  Does not  bruise/bleed easily.  Psychiatric/Behavioral: Negative.     Blood pressure 92/53, pulse 88, temperature 98.9 F (37.2 C), temperature source Oral, resp. rate 11, height 5\' 7"  (1.702 m), weight 68.04 kg (150 lb), SpO2 97.00%. Physical Exam  Constitutional: He is oriented to person, place, and time. He appears well-developed and well-nourished.  Wife at bedside  HENT:  Head: Normocephalic.  Small hematoma on forehand with small abrasion 3x5 cm. Non-expansile  Eyes: Pupils are equal, round, and reactive to light.  Neck: Normal range of motion. Neck supple.  Cardiovascular: Normal rate and regular rhythm.   Respiratory: Effort normal and breath sounds normal.  GI: Soft. Bowel sounds are normal.  Genitourinary: Rectum normal and penis normal.  No gross blood per recutm on exam. DRE NOT performed. No hemorroids.   Musculoskeletal: Normal range of motion.  Neurological: He is alert and oriented to person, place, and time. No cranial nerve deficit. He exhibits normal muscle tone. Coordination normal.  Has symmetric smile / grimace  Skin: Skin is warm and dry.  Psychiatric: He has a normal mood and affect. His behavior is normal. Judgment and thought content normal.     Assessment/Plan  1 - Syncope after Prostate Biopsy - likely vasovagal episode and some acute blood loss per recutm. Hgb acceptable. No focal neurologic deficits. ER CV eval normal. Will admit for observation, Hgb rechecks, telemetry, IVF.  Will consider future coagulopathy work-up given impressive bleeding from uncomplicated procedure w/o recent anticoagulants. Pt T+C per ER.    Zakry Caso 08/21/2013, 6:10 PM

## 2013-08-21 NOTE — ED Provider Notes (Signed)
CSN: [id number]     Arrival date & time 08/21/13  1500 History   First MD Initiated Contact with Patient 08/21/13 1517     Chief Complaint  Patient presents with  . Hypotension   (Consider location/radiation/quality/duration/timing/severity/associated sxs/prior Treatment) The history is provided by the patient. No language interpreter was used.  George Ramos is a 57 y/o M with PMH of prostate enlargement presenting to the ED, brought in by EMS, after syncopal episode that occurred today. Patient recently has prostate biopsy this morning at approximately 8:45-9:00AM, performed by Dr. Berneice Heinrich. Patient reported that the procedure went well, but shortly after the procedure he did not feel well. Patient reported that he had at least 6 episodes of bright red bloody stools that occurred today after the event. Patient reported that when he went to the bathroom one time he felt extremely light-headed and fainted while on the toilet. Patient reported that he fell over while on the toilet, while in the process patient hit his head - wife reported that this occurred at approximately 2:45PM. Patient reported that he thinks he did have a short period of loss of consciousness. Wife reported that when she found him he was still laying on the floor of the bathroom - wife reported that when she found him he was alert and oriented to what was going on around him. Patient cannot recall how long he was out for, wife cannot recall either. As per EMS report, patient's systolic was 80, CBG 150. Upon arrival to the ED blood pressure raised to 122/67 - IV placed in left antecubital fossa with fluids running on the patient. Patient reported that he has a mild headache, denied dizziness. Patient reported that he has been feeling nauseous throughout the day. Patient reported that he had a prostate biopsy approximately 7 weeks ago - denied any issues like this before. Patient denied being on blood thinners. Denied dizziness, blurred  vision, sudden loss of vision, numbness, tingling, chest pain, shortness of breath, difficulty breathing, neck pain, neck stiffness, vomiting.  PCP: Urologist: Dr. Berneice Heinrich  Past Medical History  Diagnosis Date  . Prostate enlargement    No past surgical history on file. No family history on file. History  Substance Use Topics  . Smoking status: Never Smoker   . Smokeless tobacco: Never Used  . Alcohol Use: No    Review of Systems  Constitutional: Positive for chills. Negative for fever and diaphoresis.  Eyes: Negative for visual disturbance.  Respiratory: Negative for chest tightness and shortness of breath.   Cardiovascular: Negative for chest pain.  Gastrointestinal: Positive for nausea, blood in stool and anal bleeding. Negative for vomiting, abdominal pain and diarrhea.  Musculoskeletal: Negative for arthralgias and neck pain.  Neurological: Positive for syncope, weakness and headaches. Negative for dizziness and numbness.  All other systems reviewed and are negative.    Allergies  Review of patient's allergies indicates no known allergies.  Home Medications   Current Outpatient Rx  Name  Route  Sig  Dispense  Refill  . gabapentin (NEURONTIN) 600 MG tablet   Oral   Take 600 mg by mouth at bedtime.         . sulfamethoxazole-trimethoprim (BACTRIM DS) 800-160 MG per tablet   Oral   Take 1 tablet by mouth 2 (two) times daily.         . nitroGLYCERIN (NITRODUR - DOSED IN MG/24 HR) 0.2 mg/hr      Apply 1/4 patch to affected area daily.  Change patch  every 24 hours.   30 patch   1    BP 92/53  Pulse 88  Temp(Src) 98.9 F (37.2 C) (Oral)  Resp 11  Ht 5\' 7"  (1.702 m)  Wt 150 lb (68.04 kg)  BMI 23.49 kg/m2  SpO2 97% Physical Exam  Nursing note and vitals reviewed. Constitutional: He is oriented to person, place, and time. He appears well-developed and well-nourished. No distress.  HENT:  Head: Normocephalic.    Mouth/Throat: Oropharynx is clear and  moist. No oropharyngeal exudate.  Small superficial abrasion noted to the lateral aspect of the right orbit, lower aspect Small hematomas noted to the frontal aspect of the forehead - discomfort upon palpation.   Eyes: Conjunctivae and EOM are normal. Pupils are equal, round, and reactive to light. Right eye exhibits no discharge. Left eye exhibits no discharge.  Neck: Normal range of motion. Neck supple.  Negative neck stiffness Negative nuchal rigidity Negative cervical LAD Negative meningeal signs   Cardiovascular: Normal rate, regular rhythm and normal heart sounds.   Pulses:      Radial pulses are 2+ on the right side, and 2+ on the left side.       Dorsalis pedis pulses are 2+ on the right side, and 2+ on the left side.  No tachycardia noted  Pulmonary/Chest: Effort normal and breath sounds normal. No respiratory distress. He has no wheezes. He has no rales.  Genitourinary: Guaiac positive stool.  Rectal exam: Bright red blood noted to the anus. Negative lesions, sores, masses, growths, hemorrhoids noted the anus. Good sphincter tone. Negative masses palpated. Bright red blood on glove, stool brown.  Exam chaperones with Tech  Musculoskeletal: Normal range of motion.  Lymphadenopathy:    He has no cervical adenopathy.  Neurological: He is alert and oriented to person, place, and time. No cranial nerve deficit. He exhibits normal muscle tone. Coordination normal. GCS eye subscore is 4. GCS verbal subscore is 5. GCS motor subscore is 6.  Cranial nerves III-XII grossly intact Alert and oriented GCS 15 Strength 5+/5+ to upper and lower extremities bilaterally with resistance applied, equal distribution noted Responds to questions appropriately Follows commands well   Skin: He is not diaphoretic. There is pallor.  Cold to the touch in the feet and hands  Psychiatric: He has a normal mood and affect. His behavior is normal. Thought content normal.    ED Course  Procedures  (including critical care time)  5:18 PM This provider spoke with Dr. Lenord Carbo from urology - discussed history, case, presentation - Urology to see and assess patient.   5:27 PM This provider spoke with Dr Madilyn Fireman, GI. Discussed history, case, presentation - GI to consult with patient.   5:59 PM Discussed case with Dr. Ezzard Flax - Dr. Ezzard Flax reported that he is going to admit the patient and monitor him, himself.   6:22 PM This provider discussed imaging and lab findings with patient and wife. Discussed plan for admission - patient and wife agreed to plan of care, understood.   Labs Review Labs Reviewed  CBC - Abnormal; Notable for the following:    WBC 11.3 (*)    HCT 38.2 (*)    All other components within normal limits  COMPREHENSIVE METABOLIC PANEL - Abnormal; Notable for the following:    Sodium 133 (*)    Glucose, Bld 141 (*)    Calcium 8.1 (*)    Total Protein 5.6 (*)    Albumin 3.3 (*)    GFR  calc non Af Amer 75 (*)    GFR calc Af Amer 87 (*)    All other components within normal limits  OCCULT BLOOD, POC DEVICE - Abnormal; Notable for the following:    Fecal Occult Bld POSITIVE (*)    All other components within normal limits  TROPONIN I  URINALYSIS, ROUTINE W REFLEX MICROSCOPIC  TYPE AND SCREEN  ABO/RH   Imaging Review Ct Head Wo Contrast  08/21/2013   CLINICAL DATA:  Syncopal episode and fall. Hematomas over the forehead. Patient had a prostate biopsy this morning.  EXAM: CT HEAD WITHOUT CONTRAST  CT CERVICAL SPINE WITHOUT CONTRAST  TECHNIQUE: Multidetector CT imaging of the head and cervical spine was performed following the standard protocol without intravenous contrast. Multiplanar CT image reconstructions of the cervical spine were also generated.  COMPARISON:  Cervical spine radiographs 09/15/2011  FINDINGS: CT HEAD FINDINGS  Bilateral frontal scalp soft tissue swelling/hematoma is evident. There is no underlying fracture. The paranasal sinuses and mastoid air  cells are clear. The osseous skull is intact.  No acute cortical infarct, hemorrhage, or mass lesion is present. The ventricles are of normal size. No significant extra-axial fluid collection is present.  CT CERVICAL SPINE FINDINGS  The cervical spine is imaged from the skullbase through T2-3. Multilevel endplate degenerative changes are evident. Uncovertebral spurring is most evident on the right at C5-6 and C6-7 and on the left at C5-6. Vertebral body heights and alignment are maintained. No acute fracture or traumatic subluxation is evident. The soft tissues of the neck are unremarkable. The lung apices are clear.  IMPRESSION: 1. Normal CT appearance of the brain. 2. Bilateral frontal scalp soft tissue swelling/hematoma without underlying fracture. 3. Multilevel spondylosis of the cervical spine without evidence for acute fracture or traumatic subluxation.   Electronically Signed   By: Gennette Pac M.D.   On: 08/21/2013 16:45   Ct Cervical Spine Wo Contrast  08/21/2013   CLINICAL DATA:  Syncopal episode and fall. Hematomas over the forehead. Patient had a prostate biopsy this morning.  EXAM: CT HEAD WITHOUT CONTRAST  CT CERVICAL SPINE WITHOUT CONTRAST  TECHNIQUE: Multidetector CT imaging of the head and cervical spine was performed following the standard protocol without intravenous contrast. Multiplanar CT image reconstructions of the cervical spine were also generated.  COMPARISON:  Cervical spine radiographs 09/15/2011  FINDINGS: CT HEAD FINDINGS  Bilateral frontal scalp soft tissue swelling/hematoma is evident. There is no underlying fracture. The paranasal sinuses and mastoid air cells are clear. The osseous skull is intact.  No acute cortical infarct, hemorrhage, or mass lesion is present. The ventricles are of normal size. No significant extra-axial fluid collection is present.  CT CERVICAL SPINE FINDINGS  The cervical spine is imaged from the skullbase through T2-3. Multilevel endplate degenerative  changes are evident. Uncovertebral spurring is most evident on the right at C5-6 and C6-7 and on the left at C5-6. Vertebral body heights and alignment are maintained. No acute fracture or traumatic subluxation is evident. The soft tissues of the neck are unremarkable. The lung apices are clear.  IMPRESSION: 1. Normal CT appearance of the brain. 2. Bilateral frontal scalp soft tissue swelling/hematoma without underlying fracture. 3. Multilevel spondylosis of the cervical spine without evidence for acute fracture or traumatic subluxation.   Electronically Signed   By: Gennette Pac M.D.   On: 08/21/2013 16:45    EKG Interpretation     Ventricular Rate:  56 PR Interval:  151 QRS Duration: 83 QT Interval:  429 QTC Calculation: 414 R Axis:   91 Text Interpretation:  Sinus rhythm Borderline right axis deviation            MDM   1. Rectal bleed   2. Prostate enlargement     Patient brought in by EMS secondary to syncopal episode that occurred today at approximately 2:45PM, patient reported that he fell off the toilet and landed on the floor and hit his head - patient reported that he had loss of consciousness, but unable to recall for how long. Patient had prostate biopsy performed this morning at approximately 8:45-9:00AM. Patient reported that he at least 6 episodes of bright red bloody stools.  Alert and oriented. GCS 15. Full ROM to upper and lower extremities bilaterally. Heart rate and rhythm normal, negative tachycardia noted at the time of exam. Lungs clear to auscultation bilaterally to upper and lower lobes. Strength intact with equal distribution. Mild pallor noted to skin with cold palpation to fingers and toes. Small superficial abrasion noted to the right orbit, lower aspect, bleeding controlled. Small hematomas noted to the frontal aspect of the forehead.  EKG negative ischemic changes noted. CBC noted normal Hgb of 13.4 with mild low Hct of 38.2. CMp noted mildly low sodium  of 133. Negative elevation in troponin. Fecal occult positive. CT head and cervical spine negative for acute abnormalities. Positive orthostatics noted.  No transfusion needed at this time secondary to proper Hgb and Hct control.  Negative findings for head bleed.  Spoke with GI - GI to consult. Spoke with Dr. Ezzard Flax from Urology, Dr. Ezzard Flax reported that he will monitor the patient and that he will take care. Dr. Ezzard Flax instructed this provider to admit the patient to him under observation, Telemetry. Patient stable for transfer.   Raymon Mutton, PA-C 08/22/13 2301

## 2013-08-21 NOTE — ED Notes (Signed)
Bed: WA07 Expected date:  Expected time:  Means of arrival:  Comments: ems- hypotensive, prostate biopsy this am

## 2013-08-22 LAB — ABO/RH: ABO/RH(D): AB POS

## 2013-08-22 LAB — CBC
HCT: 31.3 % — ABNORMAL LOW (ref 39.0–52.0)
MCH: 30.7 pg (ref 26.0–34.0)
MCV: 85.1 fL (ref 78.0–100.0)
Platelets: 165 10*3/uL (ref 150–400)
RBC: 3.68 MIL/uL — ABNORMAL LOW (ref 4.22–5.81)
RDW: 13 % (ref 11.5–15.5)

## 2013-08-22 LAB — HEMOGLOBIN AND HEMATOCRIT, BLOOD: HCT: 32.8 % — ABNORMAL LOW (ref 39.0–52.0)

## 2013-08-22 NOTE — Progress Notes (Signed)
D/C instructions reviewed w/ pt and wife. All questions answered, no further questions. Pt d/c in w/c by this writer in stable condition. Pt in possession of d/c instructions and all personal belongings. No scripts.

## 2013-08-22 NOTE — Progress Notes (Signed)
Pt has had 2 BM which were positive for blood, blood noted around stool. Stool brown with blood noted. Pt voided dark tea colored urine with some blood also noted in the urine.SP, RN

## 2013-08-22 NOTE — Consult Note (Signed)
Patient NOT seen by me. Patient discharged prior to consult being done.

## 2013-08-22 NOTE — Progress Notes (Signed)
Utilization review completed.  

## 2013-08-22 NOTE — Discharge Summary (Signed)
Physician Discharge Summary  Patient ID: George Ramos MRN: [id number] DOB/AGE: 1956/06/11 57 y.o.  Admit date: 08/21/2013 Discharge date: 08/22/2013  Admission Diagnoses: Syncope after Prostate Biopsy   Discharge Diagnoses: Syncope after Prostate Biopsy   Discharged Condition: good  Hospital Course:   1 - Syncope after Prostate Biopsy - pt admitted through ER for syncopal episode and some new bloody stools several hours after prostate biopsy. CT head normal. Hgb stable overnight. Stools returnign to normal patterns with resolution of bright red blood. By hospital day 2 pt felt adequate for discharge. Basic coagulation studies also normal.    Consults: None  Significant Diagnostic Studies: radiology: CT scan: head - no acute abnormalites  Treatments: IV hydration  Discharge Exam: Blood pressure 100/51, pulse 66, temperature 99 F (37.2 C), temperature source Oral, resp. rate 19, height 5\' 7"  (1.702 m), weight 68.04 kg (150 lb), SpO2 99.00%. General appearance: alert, cooperative, appears stated age and wife at bedside. Pt at baseline.  Head: Normocephalic, without obvious abnormality, atraumatic Eyes: conjunctivae/corneas clear. PERRL, EOM's intact. Fundi benign. Ears: normal TM's and external ear canals both ears Nose: Nares normal. Septum midline. Mucosa normal. No drainage or sinus tenderness. Throat: lips, mucosa, and tongue normal; teeth and gums normal Neck: no adenopathy, no carotid bruit, no JVD, supple, symmetrical, trachea midline and thyroid not enlarged, symmetric, no tenderness/mass/nodules Back: symmetric, no curvature. ROM normal. No CVA tenderness. Resp: clear to auscultation bilaterally Chest wall: no tenderness Cardio: regular rate and rhythm, S1, S2 normal, no murmur, click, rub or gallop GI: soft, non-tender; bowel sounds normal; no masses,  no organomegaly Male genitalia: normal Extremities: extremities normal, atraumatic, no cyanosis or edema Pulses:  2+ and symmetric Skin: Skin color, texture, turgor normal. No rashes or lesions Lymph nodes: Cervical, supraclavicular, and axillary nodes normal. Neurologic: Grossly normal. No focal deficits.  Rectal - no gross blood per rectum Forehead - improves small bruise, no hematomas of TTP  Disposition: Final discharge disposition not confirmed     Medication List         gabapentin 600 MG tablet  Commonly known as:  NEURONTIN  Take 600 mg by mouth at bedtime.     nitroGLYCERIN 0.2 mg/hr patch  Commonly known as:  NITRODUR - Dosed in mg/24 hr  Apply 1/4 patch to affected area daily.  Change patch every 24 hours.     sulfamethoxazole-trimethoprim 800-160 MG per tablet  Commonly known as:  BACTRIM DS  Take 1 tablet by mouth 2 (two) times daily.           Follow-up Information   Follow up with Sebastian Ache, MD. (as scheduled in about 2 weeks for MD visit. Dr. Berneice Heinrich will call you with biopsy results prior.)    Specialty:  Urology   Contact information:   [address] [place] [address] [phone]       Signed: Sebastian Ache 08/22/2013, 12:29 PM

## 2013-08-22 NOTE — Care Management Note (Signed)
    Page 1 of 1   08/22/2013     2:00:24 PM   CARE MANAGEMENT NOTE 08/22/2013  Patient:  George Ramos, George Ramos   Account Number:  [id number]  Date Initiated:  08/22/2013  Documentation initiated by:  Lanier Clam  Subjective/Objective Assessment:   57 Y/O M ADMITTED W/PROSTATE ENLARGEMENT,SYNCOPE.     Action/Plan:   FROM HOME.   Anticipated DC Date:  08/22/2013   Anticipated DC Plan:  HOME/SELF CARE      DC Planning Services  CM consult      Choice offered to / List presented to:             Status of service:  Completed, signed off Medicare Important Message given?   (If response is "NO", the following Medicare IM given date fields will be blank) Date Medicare IM given:   Date Additional Medicare IM given:    Discharge Disposition:  HOME/SELF CARE  Per UR Regulation:  Reviewed for med. necessity/level of care/duration of stay  If discussed at Long Length of Stay Meetings, dates discussed:    Comments:  08/22/13 Jaynie Hitch RN,BSN NCM [phone] D/C HOME NO NEEDS OR ORDERS.

## 2013-08-24 NOTE — ED Provider Notes (Signed)
Medical screening examination/treatment/procedure(s) were performed by non-physician practitioner and as supervising physician I was immediately available for consultation/collaboration.   Nelia Shi, MD 08/24/13 [phone]

## 2013-09-12 ENCOUNTER — Telehealth: Payer: Self-pay | Admitting: Hematology and Oncology

## 2013-09-12 NOTE — Telephone Encounter (Signed)
S/W PT AND GVE NP APPT 12/11 @ 9:30 W/DR. GORSUCH REFERRING DR. MANNY DX- R/O COAG HX OF EASY BLEEDING WELCOME PACKET MAILED.

## 2013-09-12 NOTE — Telephone Encounter (Signed)
C/D 09/12/13 for appt. 09/20/13

## 2013-09-20 ENCOUNTER — Encounter: Payer: Self-pay | Admitting: Hematology and Oncology

## 2013-09-20 ENCOUNTER — Ambulatory Visit (HOSPITAL_BASED_OUTPATIENT_CLINIC_OR_DEPARTMENT_OTHER): Payer: BC Managed Care – PPO | Admitting: Hematology and Oncology

## 2013-09-20 ENCOUNTER — Telehealth: Payer: Self-pay | Admitting: Hematology and Oncology

## 2013-09-20 ENCOUNTER — Ambulatory Visit: Payer: BC Managed Care – PPO

## 2013-09-20 ENCOUNTER — Ambulatory Visit (HOSPITAL_BASED_OUTPATIENT_CLINIC_OR_DEPARTMENT_OTHER): Payer: BC Managed Care – PPO

## 2013-09-20 VITALS — BP 126/59 | HR 53 | Temp 97.9°F | Resp 18 | Ht 67.0 in | Wt 158.0 lb

## 2013-09-20 DIAGNOSIS — R58 Hemorrhage, not elsewhere classified: Secondary | ICD-10-CM

## 2013-09-20 DIAGNOSIS — D500 Iron deficiency anemia secondary to blood loss (chronic): Secondary | ICD-10-CM

## 2013-09-20 LAB — CBC WITH DIFFERENTIAL/PLATELET
BASO%: 1 % (ref 0.0–2.0)
EOS%: 0.6 % (ref 0.0–7.0)
Eosinophils Absolute: 0 10*3/uL (ref 0.0–0.5)
LYMPH%: 25.2 % (ref 14.0–49.0)
MCHC: 33.5 g/dL (ref 32.0–36.0)
MCV: 88.4 fL (ref 79.3–98.0)
MONO#: 0.6 10*3/uL (ref 0.1–0.9)
NEUT#: 3.9 10*3/uL (ref 1.5–6.5)
RBC: 5.01 10*6/uL (ref 4.20–5.82)
RDW: 13.3 % (ref 11.0–14.6)
WBC: 6.1 10*3/uL (ref 4.0–10.3)
lymph#: 1.5 10*3/uL (ref 0.9–3.3)

## 2013-09-20 NOTE — Progress Notes (Signed)
[facility] CONSULT NOTE  Patient Care Team: Provider Not In System as PCP - General  CHIEF COMPLAINTS/PURPOSE OF CONSULTATION:  Recent excessive bleeding, rule out bleeding disorder  HISTORY OF PRESENTING ILLNESS:  George Ramos 57 y.o. male is here because of recent excessive bleeding after prostate biopsy. On 06/26/2013, the patient had prostate biopsy done which showed some mild abnormalities. He was being observed. The patient had benign prostatic hypertrophy with some baseline mild difficulties with urination. On 08/21/2013, he had repeat prostate biopsy and soon after that, he had multiple syncopal episodes at home with recurrent falls. The patient had passage of bright red blood per rectum for 6 times and was brought to the emergency department. He was hospitalized for 2 days and had mild reduction in his blood count. He was subsequently discharged after he stopped bleeding. 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 He denies any excessive bleeding as a child after circumcision. Denies any gum bleeding or easy bruising. The patient had hernia repair when he was a teenager and denies any bleeding after that. He denies prior blood transfusions. The patient denies any recent signs or symptoms of bleeding such as spontaneous epistaxis, hematuria or hematochezia.  MEDICAL HISTORY:  Past Medical History  Diagnosis Date  . Prostate enlargement   . Arthritis   . Bleeding 09/20/2013    SURGICAL HISTORY: Past Surgical History  Procedure Laterality Date  . Hernia repair  age 77  . Colonoscopy      SOCIAL HISTORY: History   Social History  . Marital Status: Married    Spouse Name: N/A    Number of Children: N/A  . Years of Education: N/A   Occupational History  . Not on file.   Social History Main Topics  . Smoking status: Never Smoker   . Smokeless tobacco: Never Used   . Alcohol Use: No  . Drug Use: No  . Sexual Activity: Not on file   Other Topics Concern  . Not on file   Social History Narrative  . No narrative on file    FAMILY HISTORY: Family History  Problem Relation Age of Onset  . Cancer Father 59    colon ca  . Cancer Sister 23    colon ca    ALLERGIES:  has No Known Allergies.  MEDICATIONS:  Current Outpatient Prescriptions  Medication Sig Dispense Refill  . gabapentin (NEURONTIN) 600 MG tablet Take 600 mg by mouth at bedtime.      . tamsulosin (FLOMAX) 0.4 MG CAPS capsule        No current facility-administered medications for this visit.    REVIEW OF SYSTEMS:   Constitutional: Denies fevers, chills or abnormal night sweats Eyes: Denies blurriness of vision, double vision or watery eyes Ears, nose, mouth, throat, and face: Denies mucositis or sore throat Respiratory: Denies cough, dyspnea or wheezes Cardiovascular: Denies palpitation, chest discomfort or lower extremity swelling Gastrointestinal:  Denies nausea, heartburn or change in bowel habits Skin: Denies abnormal skin rashes Lymphatics: Denies new lymphadenopathy or easy bruising Neurological:Denies numbness, tingling or new weaknesses Behavioral/Psych: Mood is stable, no new changes  All other systems were reviewed with the patient and are negative.  PHYSICAL EXAMINATION: ECOG PERFORMANCE STATUS: 0 - Asymptomatic  Filed Vitals:   09/20/13 0951  BP: 126/59  Pulse: 53  Temp: 97.9 F (36.6 C)  Resp: 18   Filed Weights   09/20/13  1610  Weight: 158 lb (71.668 kg)    GENERAL:alert, no distress and comfortable SKIN: skin color, texture, turgor are normal, no rashes or significant lesions EYES: normal, conjunctiva are pink and non-injected, sclera clear OROPHARYNX:no exudate, no erythema and lips, buccal mucosa, and tongue normal  NECK: supple, thyroid normal size, non-tender, without nodularity LYMPH:  no palpable lymphadenopathy in the cervical, axillary  or inguinal LUNGS: clear to auscultation and percussion with normal breathing effort HEART: regular rate & rhythm and no murmurs and no lower extremity edema ABDOMEN:abdomen soft, non-tender and normal bowel sounds Musculoskeletal:no cyanosis of digits and no clubbing  PSYCH: alert & oriented x 3 with fluent speech NEURO: no focal motor/sensory deficits  LABORATORY DATA:  I have reviewed the data as listed Recent Results (from the past 2160 hour(s))  ABO/RH     Status: None   Collection Time    08/21/13  3:21 PM      Result Value Range   ABO/RH(D) AB POS    OCCULT BLOOD, POC DEVICE     Status: Abnormal   Collection Time    08/21/13  3:46 PM      Result Value Range   Fecal Occult Bld POSITIVE (*) NEGATIVE  CBC     Status: Abnormal   Collection Time    08/21/13  4:00 PM      Result Value Range   WBC 11.3 (*) 4.0 - 10.5 K/uL   RBC 4.49  4.22 - 5.81 MIL/uL   Hemoglobin 13.4  13.0 - 17.0 g/dL   HCT 96.0 (*) 45.4 - 09.8 %   MCV 85.1  78.0 - 100.0 fL   MCH 29.8  26.0 - 34.0 pg   MCHC 35.1  30.0 - 36.0 g/dL   RDW 11.9  14.7 - 82.9 %   Platelets 182  150 - 400 K/uL  COMPREHENSIVE METABOLIC PANEL     Status: Abnormal   Collection Time    08/21/13  4:00 PM      Result Value Range   Sodium 133 (*) 135 - 145 mEq/L   Potassium 4.6  3.5 - 5.1 mEq/L   Chloride 102  96 - 112 mEq/L   CO2 23  19 - 32 mEq/L   Glucose, Bld 141 (*) 70 - 99 mg/dL   BUN 16  6 - 23 mg/dL   Creatinine, Ser 5.62  0.50 - 1.35 mg/dL   Calcium 8.1 (*) 8.4 - 10.5 mg/dL   Total Protein 5.6 (*) 6.0 - 8.3 g/dL   Albumin 3.3 (*) 3.5 - 5.2 g/dL   AST 16  0 - 37 U/L   ALT 15  0 - 53 U/L   Alkaline Phosphatase 64  39 - 117 U/L   Total Bilirubin 0.8  0.3 - 1.2 mg/dL   GFR calc non Af Amer 75 (*) >90 mL/min   GFR calc Af Amer 87 (*) >90 mL/min   Comment: (NOTE)     The eGFR has been calculated using the CKD EPI equation.     This calculation has not been validated in all clinical situations.     eGFR's persistently  <90 mL/min signify possible Chronic Kidney     Disease.  TROPONIN I     Status: None   Collection Time    08/21/13  4:00 PM      Result Value Range   Troponin I <0.30  <0.30 ng/mL   Comment:            Due  to the release kinetics of cTnI,     a negative result within the first hours     of the onset of symptoms does not rule out     myocardial infarction with certainty.     If myocardial infarction is still suspected,     repeat the test at appropriate intervals.  TYPE AND SCREEN     Status: None   Collection Time    08/21/13  4:00 PM      Result Value Range   ABO/RH(D) AB POS     Antibody Screen NEG     Sample Expiration 08/24/2013    URINALYSIS, ROUTINE W REFLEX MICROSCOPIC     Status: Abnormal   Collection Time    08/21/13  4:35 PM      Result Value Range   Color, Urine RED (*) YELLOW   Comment: BIOCHEMICALS MAY BE AFFECTED BY COLOR   APPearance TURBID (*) CLEAR   Specific Gravity, Urine 1.032 (*) 1.005 - 1.030   pH 6.0  5.0 - 8.0   Glucose, UA NEGATIVE  NEGATIVE mg/dL   Hgb urine dipstick LARGE (*) NEGATIVE   Bilirubin Urine SMALL (*) NEGATIVE   Ketones, ur NEGATIVE  NEGATIVE mg/dL   Protein, ur 30 (*) NEGATIVE mg/dL   Urobilinogen, UA 0.2  0.0 - 1.0 mg/dL   Nitrite NEGATIVE  NEGATIVE   Leukocytes, UA SMALL (*) NEGATIVE  URINE MICROSCOPIC-ADD ON     Status: None   Collection Time    08/21/13  4:35 PM      Result Value Range   WBC, UA 3-6  <3 WBC/hpf   RBC / HPF TOO NUMEROUS TO COUNT  <3 RBC/hpf   Urine-Other MUCOUS PRESENT    PROTIME-INR     Status: None   Collection Time    08/21/13  6:24 PM      Result Value Range   Prothrombin Time 14.4  11.6 - 15.2 seconds   INR 1.14  0.00 - 1.49  APTT     Status: None   Collection Time    08/21/13  6:24 PM      Result Value Range   aPTT 25  24 - 37 seconds  CBC     Status: Abnormal   Collection Time    08/22/13  3:20 AM      Result Value Range   WBC 6.1  4.0 - 10.5 K/uL   RBC 3.68 (*) 4.22 - 5.81 MIL/uL    Hemoglobin 11.3 (*) 13.0 - 17.0 g/dL   HCT 40.9 (*) 81.1 - 91.4 %   MCV 85.1  78.0 - 100.0 fL   MCH 30.7  26.0 - 34.0 pg   MCHC 36.1 (*) 30.0 - 36.0 g/dL   RDW 78.2  95.6 - 21.3 %   Platelets 165  150 - 400 K/uL  HEMOGLOBIN AND HEMATOCRIT, BLOOD     Status: Abnormal   Collection Time    08/22/13  9:53 AM      Result Value Range   Hemoglobin 11.8 (*) 13.0 - 17.0 g/dL   HCT 08.6 (*) 57.8 - 46.9 %    RADIOGRAPHIC STUDIES: I have personally reviewed the radiological images as listed and agreed with the findings in the report. With his recurrent recent fall, he did not sustain major head injuries  ASSESSMENT:  Recent excessive bleeding after prostate biopsy  PLAN:  #1 recent excessive bleeding #2 benign prostate hypertrophy #3 recent anemia secondary to bleeding   I had a  long discussion with the patient today. He asked a lot of questions related to the need of ordering an additional workup to rule out rare bleeding disorder. He was upset that he has to spend a lot of money due to recent postsurgical complication. Based on his history, I think the chance of him having a rare blood disorder is minimum. He tolerated the first prostate biopsy without excessive bleeding. He had hernia repair in the past without excessive bleeding. His most recent platelet count, PT and INR, a PTT were within normal limits.  I explained to him the rationale of ordering an additional workup to exclude rare bleeding disorder. He desired to have prostate surgery in the near future to improve his urine flow. Due to the rich supply of urokinase in the urogenital system, it is well known that prostate surgery could cause more bleeding than usual.  I explained to him the limitation of screening for rare bleeding disorder in his situation. Normal preoperative hematological blood tests are not predictive of future bleeding risk.   After a lot of discussion, he ultimately consented to proceed to have an additional  workup. I will repeat a CBC to check the status of his anemia, platelet aggregation study to evaluate his platelet function and a von Willebrand's panel to exclude possibility of mild von Willebrand's disease. I will also order a fibrinogen level.  I will see him back in 10 days to review test results.  Orders Placed This Encounter  Procedures  . Von Willebrand panel    Standing Status: Future     Number of Occurrences:      Standing Expiration Date: 09/20/2014  . Fibrinogen    Standing Status: Future     Number of Occurrences:      Standing Expiration Date: 09/20/2014  . Platelet aggregation study, blood    Standing Status: Future     Number of Occurrences:      Standing Expiration Date: 09/20/2014  . CBC with Differential    Standing Status: Future     Number of Occurrences:      Standing Expiration Date: 06/12/2014    All questions were answered. The patient knows to call the clinic with any problems, questions or concerns. I spent 55 minutes counseling the patient face to face. The total time spent in the appointment was 60 minutes and more than 50% was on counseling.     Ardeth Repetto, MD 09/20/2013 10:28 AM

## 2013-09-20 NOTE — Progress Notes (Signed)
Checked in new pt with no financial concerns. °

## 2013-09-20 NOTE — Telephone Encounter (Signed)
gv and printed appt sched and avs for pt for DEC...sent pt to lab °

## 2013-09-23 LAB — FIBRINOGEN: Fibrinogen: 309 mg/dL (ref 204–475)

## 2013-09-24 ENCOUNTER — Encounter: Payer: Self-pay | Admitting: *Deleted

## 2013-09-24 ENCOUNTER — Telehealth: Payer: Self-pay | Admitting: Hematology and Oncology

## 2013-09-24 ENCOUNTER — Encounter: Payer: Self-pay | Admitting: Hematology and Oncology

## 2013-09-24 NOTE — Progress Notes (Signed)
Faxed letter from Dr. Bertis Ruddy dated 09/24/13 to Dr. Berneice Heinrich at fax [phone].

## 2013-09-24 NOTE — Telephone Encounter (Signed)
I reviewed his test results with the patient over the phone. His fibrinogen level, CBC, platelet aggregation study and  von Willebrand's panel all came back negative. The patient does not have a bleeding disorder. He requested his return appointment to be canceled. I will forward a letter and this note to his urologist, the referring physician to inform heme about our findings.

## 2013-09-24 NOTE — Telephone Encounter (Signed)
per 12/15 Staff Message from Dr Bertis Ruddy appt canc and pt aware shh

## 2013-09-25 LAB — PLATELET AGGREGATION STUDY, BLOOD
Arachidonic Acid: NORMAL
Ristocetin: NORMAL
Thrombin Receptor: NORMAL

## 2013-10-01 ENCOUNTER — Ambulatory Visit: Payer: BC Managed Care – PPO | Admitting: Hematology and Oncology

## 2013-10-29 ENCOUNTER — Other Ambulatory Visit: Payer: Self-pay | Admitting: Urology

## 2013-11-12 ENCOUNTER — Encounter (HOSPITAL_BASED_OUTPATIENT_CLINIC_OR_DEPARTMENT_OTHER): Payer: Self-pay | Admitting: *Deleted

## 2013-11-12 NOTE — Progress Notes (Signed)
SPOKE W/ PT WIFE. NPO AFTER MN. ARRIVE AT 0600. NEEDS HG.

## 2013-11-19 NOTE — H&P (Signed)
History of Present Illness    1 - Elevated PSA - No FHX prostate cancer.  Recent History:  2011 - 2.89  2013 - 3.5  06/2013 - 5.09 (pre finasteride)/ DRE 60gm+, smooth --> Prostate Biopsy 06/2013 1 core ASAP Left Lateral Apex  08/2013 - Repeat Prostate biopsy --> NEGATIVE; 65 gram prostate --> negative coagulopathy eval prompted by his impressive rectal bleeding following his most recent prostate biopsy.       2- Lower Urinary Tract Symptoms - pt with progressive bother from obstructive LUTS x several years, mostly with weak stream, hesitancy, and most bothered by nocturia x 2-3. TRUS 34mL bilobar hypertrophy. Minimal symptom improvement with emperic tamsulosin + finasteride by PCP. PVR's 2014 120-284mL. No LE neuropathy or bowel changes.    PMH sig for hernia repair, HLD, negative coagulopathy eval. No CV disease. No blood thinners.      Today, [name] is seen in consultation for BPH, lower urinary tract symptoms and incomplete bladder emptying. As stated above the patient has a weak stream also has occasional urgency and nocturia. He has not had gross hematuria. He has no recurrent urinary tract infections or dysuria. He is not on finasteride. He continues tamsulosin.     Past Medical History Problems  1. History of Back Pain 2. History of No Medical Problems  Surgical History Problems  1. History of Hernia Repair  Current Meds 1. Gabapentin 600 MG Oral Tablet; gor back pain;  Therapy: (Recorded:19Aug2014) to Recorded 2. Multi-Vitamin Oral Tablet;  Therapy: (Recorded:19Aug2014) to Recorded 3. Tamsulosin HCl - 0.4 MG Oral Capsule; TAKE 2 CAPSULEs BY MOUTH DAILY AS  DIRECTED;  Therapy: [id number] to (Last Rx:14Nov2014)  Requested for: [id number] Ordered  Allergies Medication  1. No Known Drug Allergies  Family History Problems  1. Family history of Colon Cancer (V16.0) : Father 2. Family history of Colon Cancer (V16.0) : Sister 3. Family history of  Family Health Status Number Of Children   1 son & 1 daughter  Social History Problems  1. Alcohol Use   1/2 2. Caffeine Use   1; coffee 3. Marital History - Currently Married 4. Never A Smoker 5. Occupation:   Clinical biochemist  Review of Systems Genitourinary, constitutional, skin, eye, otolaryngeal, hematologic/lymphatic, cardiovascular, pulmonary, endocrine, musculoskeletal, gastrointestinal, neurological and psychiatric system(s) were reviewed and pertinent findings if present are noted.    Vitals Vital Signs [Data Includes: Last 1 Day]  Recorded: 12Jan2015 04:29PM  Blood Pressure: 112 / 65 Temperature: 97.9 F Heart Rate: 57  Physical Exam Constitutional: Well nourished and well developed . No acute distress.  Pulmonary: No respiratory distress and normal respiratory rhythm and effort.  Neuro/Psych:. Mood and affect are appropriate.    Results/Data  The following images/tracing/specimen were independently visualized: Marland Kitchen    Assessment Assessed  1. Urinary retention (788.20)  Discussion/Summary I spent 25 minutes with the patient and his wife discussing the prostate anatomy and the location of BPH enlargement in the transition zone around the urethra compared to the occurrence of prostate cancer in the peripheral zone and the back of the prostate. We discussed how treatments for BPH and prostate cancer different in regards to these anatomical considerations. We discussed after treatment of BPH and he will continue to need prostate cancer screening with digital rectal exams and PSA. We discussed that he may need a prostate biopsy in the future and that he may develop prostate cancer in the future.     In regards to BPH treatment we went  over in detail the nature risk and benefits of surveillance, alpha blockers, 5 alpha reductase inhibitors, overactive bladder medications, combination therapy and procedures such as microwave, monopolar TURP, bipolar TURP, holmium laser  ablation for enucleation of the prostate and finally greenlight photo vaporization. We discussed risk of greenlight such as scar tissue formation/stricture, incontinence, impotence among others. We discussed typically with outlet procedures urine flow is improved as well as bladder emptying, but irritative symptoms might persist and in some cases worsen. In light of this we discussed the role of cystoscopy and UDs in evaluating LUTS to determine the role of BOO and/or OAB in regards to his symptoms. He declined to proceed with these investigations. We also discussed the greenlight can be associated with irritative frequency, urgency and dysuria in the postop period as well as a higher incidence of stricture/scar tissue formation compared to TURP. All questions answered. He would like to schedule greenlight photo vaporization. We discussed typically I'll send patient home with a Foley catheter although he may need to stay in hospital overnight again if he has some bleeding.        Signatures Electronically signed by : Festus Aloe, M.D.; Oct 22 2013  9:43PM EST

## 2013-11-20 ENCOUNTER — Encounter (HOSPITAL_BASED_OUTPATIENT_CLINIC_OR_DEPARTMENT_OTHER)
Admission: RE | Disposition: A | Discharge status: Home / Self Care | Payer: Self-pay | Source: Ambulatory Visit | Attending: Urology

## 2013-11-20 ENCOUNTER — Ambulatory Visit (HOSPITAL_BASED_OUTPATIENT_CLINIC_OR_DEPARTMENT_OTHER)
Admission: RE | Admit: 2013-11-20 | Discharge: 2013-11-20 | Disposition: A | Discharge status: Home / Self Care | Payer: BC Managed Care – PPO | Source: Ambulatory Visit | Attending: Urology | Admitting: Urology

## 2013-11-20 ENCOUNTER — Encounter (HOSPITAL_BASED_OUTPATIENT_CLINIC_OR_DEPARTMENT_OTHER): Payer: BC Managed Care – PPO | Admitting: Anesthesiology

## 2013-11-20 ENCOUNTER — Encounter (HOSPITAL_BASED_OUTPATIENT_CLINIC_OR_DEPARTMENT_OTHER): Payer: Self-pay

## 2013-11-20 ENCOUNTER — Ambulatory Visit (HOSPITAL_BASED_OUTPATIENT_CLINIC_OR_DEPARTMENT_OTHER): Payer: BC Managed Care – PPO | Admitting: Anesthesiology

## 2013-11-20 DIAGNOSIS — R339 Retention of urine, unspecified: Secondary | ICD-10-CM | POA: Insufficient documentation

## 2013-11-20 DIAGNOSIS — Z800 Family history of malignant neoplasm of digestive organs: Secondary | ICD-10-CM | POA: Insufficient documentation

## 2013-11-20 DIAGNOSIS — R3911 Hesitancy of micturition: Secondary | ICD-10-CM | POA: Insufficient documentation

## 2013-11-20 DIAGNOSIS — E785 Hyperlipidemia, unspecified: Secondary | ICD-10-CM | POA: Insufficient documentation

## 2013-11-20 DIAGNOSIS — R972 Elevated prostate specific antigen [PSA]: Secondary | ICD-10-CM | POA: Insufficient documentation

## 2013-11-20 DIAGNOSIS — R351 Nocturia: Secondary | ICD-10-CM | POA: Insufficient documentation

## 2013-11-20 DIAGNOSIS — N138 Other obstructive and reflux uropathy: Secondary | ICD-10-CM | POA: Insufficient documentation

## 2013-11-20 DIAGNOSIS — Z79899 Other long term (current) drug therapy: Secondary | ICD-10-CM | POA: Insufficient documentation

## 2013-11-20 DIAGNOSIS — N401 Enlarged prostate with lower urinary tract symptoms: Secondary | ICD-10-CM | POA: Insufficient documentation

## 2013-11-20 HISTORY — DX: Spinal stenosis, lumbar region without neurogenic claudication: M48.061

## 2013-11-20 HISTORY — PX: GREEN LIGHT LASER TURP (TRANSURETHRAL RESECTION OF PROSTATE: SHX6260

## 2013-11-20 HISTORY — DX: Retention of urine, unspecified: R33.9

## 2013-11-20 HISTORY — DX: Nocturia: R35.1

## 2013-11-20 HISTORY — DX: Spondylosis without myelopathy or radiculopathy, cervical region: M47.812

## 2013-11-20 HISTORY — DX: Benign prostatic hyperplasia without lower urinary tract symptoms: N40.0

## 2013-11-20 LAB — POCT HEMOGLOBIN-HEMACUE: HEMOGLOBIN: 15.6 g/dL (ref 13.0–17.0)

## 2013-11-20 SURGERY — GREEN LIGHT LASER TURP (TRANSURETHRAL RESECTION OF PROSTATE
Anesthesia: General | Site: Prostate

## 2013-11-20 MED ORDER — BELLADONNA ALKALOIDS-OPIUM 16.2-60 MG RE SUPP
RECTAL | Status: AC
  Filled 2013-11-20: qty 1

## 2013-11-20 MED ORDER — MIDAZOLAM HCL 5 MG/5ML IJ SOLN
INTRAMUSCULAR | Status: DC | PRN
  Administered 2013-11-20: 2 mg via INTRAVENOUS

## 2013-11-20 MED ORDER — CEFAZOLIN SODIUM 1-5 GM-% IV SOLN
1.0000 g | INTRAVENOUS | Status: DC
  Filled 2013-11-20: qty 50

## 2013-11-20 MED ORDER — PROMETHAZINE HCL 25 MG/ML IJ SOLN
6.2500 mg | INTRAMUSCULAR | Status: DC | PRN
  Filled 2013-11-20: qty 1

## 2013-11-20 MED ORDER — LACTATED RINGERS IV SOLN
INTRAVENOUS | Status: DC
  Administered 2013-11-20 (×2): via INTRAVENOUS
  Filled 2013-11-20: qty 1000

## 2013-11-20 MED ORDER — CEFAZOLIN SODIUM-DEXTROSE 2-3 GM-% IV SOLR
2.0000 g | INTRAVENOUS | Status: AC
  Administered 2013-11-20: 2 g via INTRAVENOUS
  Filled 2013-11-20: qty 50

## 2013-11-20 MED ORDER — FENTANYL CITRATE 0.05 MG/ML IJ SOLN
INTRAMUSCULAR | Status: AC
  Filled 2013-11-20: qty 4

## 2013-11-20 MED ORDER — LIDOCAINE HCL (CARDIAC) 20 MG/ML IV SOLN
INTRAVENOUS | Status: DC | PRN
  Administered 2013-11-20: 80 mg via INTRAVENOUS

## 2013-11-20 MED ORDER — KETOROLAC TROMETHAMINE 30 MG/ML IJ SOLN
15.0000 mg | Freq: Once | INTRAMUSCULAR | Status: DC | PRN
  Filled 2013-11-20: qty 1

## 2013-11-20 MED ORDER — KETOROLAC TROMETHAMINE 30 MG/ML IJ SOLN
INTRAMUSCULAR | Status: DC | PRN
  Administered 2013-11-20: 30 mg via INTRAVENOUS

## 2013-11-20 MED ORDER — ONDANSETRON HCL 4 MG/2ML IJ SOLN
INTRAMUSCULAR | Status: DC | PRN
  Administered 2013-11-20: 4 mg via INTRAVENOUS

## 2013-11-20 MED ORDER — HYDROMORPHONE HCL PF 1 MG/ML IJ SOLN
0.2500 mg | INTRAMUSCULAR | Status: DC | PRN
  Filled 2013-11-20: qty 1

## 2013-11-20 MED ORDER — BELLADONNA ALKALOIDS-OPIUM 16.2-60 MG RE SUPP
RECTAL | Status: DC | PRN
  Administered 2013-11-20: 1 via RECTAL

## 2013-11-20 MED ORDER — CIPROFLOXACIN HCL 500 MG PO TABS: 500.0000 mg | ORAL_TABLET | Freq: Two times a day (BID) | ORAL | Status: DC

## 2013-11-20 MED ORDER — PROPOFOL 10 MG/ML IV BOLUS
INTRAVENOUS | Status: DC | PRN
  Administered 2013-11-20: 200 mg via INTRAVENOUS

## 2013-11-20 MED ORDER — OXYCODONE-ACETAMINOPHEN 5-325 MG PO TABS: 1.0000 | ORAL_TABLET | Freq: Four times a day (QID) | ORAL | Status: DC | PRN

## 2013-11-20 MED ORDER — ACETAMINOPHEN 10 MG/ML IV SOLN
INTRAVENOUS | Status: DC | PRN
  Administered 2013-11-20: 1000 mg via INTRAVENOUS

## 2013-11-20 MED ORDER — MIDAZOLAM HCL 2 MG/2ML IJ SOLN
INTRAMUSCULAR | Status: AC
  Filled 2013-11-20: qty 2

## 2013-11-20 MED ORDER — SODIUM CHLORIDE 0.9 % IR SOLN
Status: DC | PRN
  Administered 2013-11-20: 13000 mL

## 2013-11-20 MED ORDER — DEXAMETHASONE SODIUM PHOSPHATE 4 MG/ML IJ SOLN
INTRAMUSCULAR | Status: DC | PRN
  Administered 2013-11-20: 10 mg via INTRAVENOUS

## 2013-11-20 MED ORDER — FENTANYL CITRATE 0.05 MG/ML IJ SOLN
INTRAMUSCULAR | Status: DC | PRN
  Administered 2013-11-20 (×2): 50 ug via INTRAVENOUS
  Administered 2013-11-20: 100 ug via INTRAVENOUS

## 2013-11-20 SURGICAL SUPPLY — 27 items
BAG URINE DRAINAGE (UROLOGICAL SUPPLIES) ×2 IMPLANT
BAG URO CATCHER STRL LF (DRAPE) ×2 IMPLANT
CATH COUDE FOLEY 2W 5CC 18FR (CATHETERS) ×1 IMPLANT
CATH FOLEY 2WAY SLVR  5CC 18FR (CATHETERS)
CATH FOLEY 2WAY SLVR 5CC 18FR (CATHETERS) IMPLANT
CLOTH BEACON ORANGE TIMEOUT ST (SAFETY) ×2 IMPLANT
DRAPE CAMERA CLOSED 9X96 (DRAPES) ×2 IMPLANT
ELECT BUTTON HF 24-28F 2 30DE (ELECTRODE) IMPLANT
ELECT LOOP MED HF 24F 12D (CUTTING LOOP) IMPLANT
ELECT LOOP MED HF 24F 12D CBL (CLIP) IMPLANT
ELECT RESECT VAPORIZE 12D CBL (ELECTRODE) IMPLANT
FEE RENTAL LASER GREENLIGHT (Laser) ×1 IMPLANT
GLOVE BIO SURGEON STRL SZ7.5 (GLOVE) ×2 IMPLANT
GLOVE BIOGEL PI IND STRL 7.5 (GLOVE) IMPLANT
GLOVE BIOGEL PI INDICATOR 7.5 (GLOVE) ×3
GLOVE SURG SS PI 7.5 STRL IVOR (GLOVE) ×2 IMPLANT
GOWN STRL REIN XL XLG (GOWN DISPOSABLE) ×1 IMPLANT
GOWN STRL REUS W/TWL XL LVL3 (GOWN DISPOSABLE) ×3 IMPLANT
HOLDER FOLEY CATH W/STRAP (MISCELLANEOUS) ×1 IMPLANT
IV NS 1000ML (IV SOLUTION) ×20
IV NS 1000ML BAXH (IV SOLUTION) ×1 IMPLANT
IV NS IRRIG 3000ML ARTHROMATIC (IV SOLUTION) ×2 IMPLANT
LASER FIBER /GREENLIGHT LASER (Laser) ×2 IMPLANT
LASER GREENLIGHT RENTAL P/PROC (Laser) ×2 IMPLANT
PACK CYSTOSCOPY (CUSTOM PROCEDURE TRAY) ×2 IMPLANT
SYR 30ML LL (SYRINGE) IMPLANT
SYRINGE IRR TOOMEY STRL 70CC (SYRINGE) IMPLANT

## 2013-11-20 NOTE — Transfer of Care (Signed)
Immediate Anesthesia Transfer of Care Note  Patient: George Ramos  Procedure(s) Performed: Procedure(s): GREEN LIGHT LASER TURP (TRANSURETHRAL RESECTION OF PROSTATE (N/A)  Patient Location: PACU  Anesthesia Type:General  Level of Consciousness: awake and oriented  Airway & Oxygen Therapy: Patient Spontanous Breathing and Patient connected to nasal cannula oxygen  Post-op Assessment: Report given to PACU RN and Post -op Vital signs reviewed and stable  Post vital signs: Reviewed and stable  Complications: No apparent anesthesia complications

## 2013-11-20 NOTE — Anesthesia Preprocedure Evaluation (Signed)

## 2013-11-20 NOTE — Anesthesia Postprocedure Evaluation (Deleted)
  Anesthesia Post-op Note  Patient: George Ramos  Procedure(s) Performed: Procedure(s) (LRB): GREEN LIGHT LASER TURP (TRANSURETHRAL RESECTION OF PROSTATE (N/A)  Patient Location: PACU  Anesthesia Type: General  Level of Consciousness: awake and alert   Airway and Oxygen Therapy: Patient Spontanous Breathing  Post-op Pain: mild  Post-op Assessment: Post-op Vital signs reviewed, Patient's Cardiovascular Status Stable, Respiratory Function Stable, Patent Airway and No signs of Nausea or vomiting  Last Vitals:  Filed Vitals:   11/20/13 0628  BP: 110/67  Pulse: 48  Temp: 36 C  Resp: 14    Post-op Vital Signs: stable   Complications: No apparent anesthesia complications

## 2013-11-20 NOTE — Interval H&P Note (Signed)
History and Physical Interval Note:  11/20/2013 7:35 AM  George Ramos  has presented today for surgery, with the diagnosis of BPH  The various methods of treatment have been discussed with the patient and family. After consideration of risks, benefits and other options for treatment, the patient has consented to  Procedure(s): GREEN LIGHT LASER TURP (TRANSURETHRAL RESECTION OF PROSTATE (N/A) as a surgical intervention .  The patient's history has been reviewed, patient examined, no change in status, stable for surgery.  I have reviewed the patient's chart and labs.  He has been well. No coughs, colds, dysuria, hematuria. I discussed with the patient the again the nature, potential benefits, risks and alternatives to GL PVP, including side effects of the proposed treatment, the likelihood of the patient achieving the goals of the procedure, and any potential problems that might occur during the procedure or recuperation. All questions answered. Patient elects to proceed.    Fredricka Bonine

## 2013-11-20 NOTE — Anesthesia Procedure Notes (Signed)
Procedure Name: LMA Insertion Date/Time: 11/20/2013 7:40 AM Performed by: Bethena Roys T Pre-anesthesia Checklist: Patient identified, Emergency Drugs available, Suction available and Patient being monitored Patient Re-evaluated:Patient Re-evaluated prior to inductionOxygen Delivery Method: Circle System Utilized Preoxygenation: Pre-oxygenation with 100% oxygen Intubation Type: IV induction Ventilation: Mask ventilation without difficulty LMA: LMA inserted LMA Size: 5.0 Number of attempts: 1 Airway Equipment and Method: bite block Placement Confirmation: positive ETCO2 Dental Injury: Teeth and Oropharynx as per pre-operative assessment

## 2013-11-20 NOTE — Anesthesia Postprocedure Evaluation (Signed)
  Anesthesia Post-op Note  Patient: George Ramos  Procedure(s) Performed: Procedure(s) (LRB): GREEN LIGHT LASER TURP (TRANSURETHRAL RESECTION OF PROSTATE (N/A)  Patient Location: PACU  Anesthesia Type: General  Level of Consciousness: awake and alert   Airway and Oxygen Therapy: Patient Spontanous Breathing  Post-op Pain: mild  Post-op Assessment: Post-op Vital signs reviewed, Patient's Cardiovascular Status Stable, Respiratory Function Stable, Patent Airway and No signs of Nausea or vomiting  Last Vitals:  Filed Vitals:   11/20/13 0904  BP: 114/66  Pulse: 63  Temp: 36.4 C  Resp: 12    Post-op Vital Signs: stable   Complications: No apparent anesthesia complications

## 2013-11-20 NOTE — Discharge Instructions (Signed)
Foley Catheter Care, Adult A Foley catheter is a soft, flexible tube that is placed into the bladder to drain urine. A Foley catheter may be inserted if:  You leak urine or are not able to control when you urinate (urinary incontinence).  You are not able to urinate when you need to (urinary retention).  You had prostate surgery or surgery on the genitals.  You have certain medical conditions, such as multiple sclerosis, dementia, or a spinal cord injury. If you are going home with a Foley catheter in place, follow the instructions below. TAKING CARE OF THE CATHETER 1. Wash your hands with soap and water. 2. Using mild soap and warm water on a clean washcloth:  Clean the area on your body closest to the catheter insertion site using a circular motion, moving away from the catheter. Never wipe toward the catheter because this could sweep bacteria up into the urethra and cause infection.  Remove all traces of soap. Pat the area dry with a clean towel. For males, reposition the foreskin. 3. Attach the catheter to your leg so there is no tension on the catheter. Use adhesive tape or a leg strap. If you are using adhesive tape, remove any sticky residue left behind by the previous tape you used. 4. Keep the drainage bag below the level of the bladder, but keep it off the floor. 5. Check throughout the day to be sure the catheter is working and urine is draining freely. Make sure the tubing does not become kinked. 6. Do not pull on the catheter or try to remove it. Pulling could damage internal tissues. TAKING CARE OF THE DRAINAGE BAGS You will be given two drainage bags to take home. One is a large overnight drainage bag, and the other is a smaller leg bag that fits underneath clothing. You may wear the overnight bag at any time, but you should never wear the smaller leg bag at night. Follow the instructions below for how to empty, change, and clean your drainage bags. Emptying the Drainage  Bag You must empty your drainage bag when it is    full or at least 2 3 times a day. 1. Wash your hands with soap and water. 2. Keep the drainage bag below your hips, below the level of your bladder. This stops urine from going back into the tubing and into your bladder. 3. Hold the dirty bag over the toilet or a clean container. 4. Open the pour spout at the bottom of the bag and empty the urine into the toilet or container. Do not let the pour spout touch the toilet, container, or any other surface. Doing so can place bacteria on the bag, which can cause an infection. 5. Clean the pour spout with a gauze pad or cotton ball that has rubbing alcohol on it. 6. Close the pour spout. 7. Attach the bag to your leg with adhesive tape or a leg strap. 8. Wash your hands well. Changing the Drainage Bag Change your drainage bag once a month or sooner if it starts to smell bad or look dirty. Below are steps to follow when changing the drainage bag. 1. Wash your hands with soap and water. 2. Pinch off the rubber catheter so that urine does not spill out. 3. Disconnect the catheter tube from the drainage tube at the connection valve. Do not let the tubes touch any surface. 4. Clean the end of the catheter tube with an alcohol wipe. Use a different alcohol wipe  to clean the end of the drainage tube. °5. Connect the catheter tube to the drainage tube of the clean drainage bag. °6. Attach the new bag to the leg with adhesive tape or a leg strap. Avoid attaching the new bag too tightly. °7. Wash your hands well. °Cleaning the Drainage Bag °1. Wash your hands with soap and water. °2. Wash the bag in warm, soapy water. °3. Rinse the bag thoroughly with warm water. °4. Fill the bag with a solution of white vinegar and water (1 cup vinegar to 1 qt warm water [.2 L vinegar to 1 L warm water]). Close the bag and soak it for 30 minutes in the solution. °5. Rinse the bag with warm water. °6. Hang the bag to dry with the  pour spout open and hanging downward. °7. Store the clean bag (once it is dry) in a clean plastic bag. °8. Wash your hands well. °PREVENTING INFECTION °· Wash your hands before and after handling your catheter. °· Take showers daily and wash the area where the catheter enters your body. Do not take baths. Replace wet leg straps with dry ones, if this applies. °· Do not use powders, sprays, or lotions on the genital area. Only use creams, lotions, or ointments as directed by your caregiver. °· For females, wipe from front to back after each bowel movement. °· Drink enough fluids to keep your urine clear or pale yellow unless you have a fluid restriction. °· Do not let the drainage bag or tubing touch or lie on the floor. °· Wear cotton underwear to absorb moisture and to keep your skin drier. °SEEK MEDICAL CARE IF:  °· Your urine is cloudy or smells unusually bad. °· Your catheter becomes clogged. °· You are not draining urine into the bag or your bladder feels full. °· Your catheter starts to leak. °SEEK IMMEDIATE MEDICAL CARE IF:  °· You have pain, swelling, redness, or pus where the catheter enters the body. °· You have pain in the abdomen, legs, lower back, or bladder. °· You have a fever. °· You see blood fill the catheter, or your urine is pink or red. °· You have nausea, vomiting, or chills. °· Your catheter gets pulled out. °MAKE SURE YOU:  °· Understand these instructions. °· Will watch your condition. °· Will get help right away if you are not doing well or get worse. °Document Released: 09/27/2005 Document Revised: 01/22/2013 Document Reviewed: 09/18/2012 °ExitCare® Patient Information ©2014 [employer]. ° °Prostate Laser Surgery, Care After °Refer to this sheet in the next few weeks. These instructions provide you with information on caring for yourself after your procedure. Your health care provider may also give you more specific instructions. Your treatment has been planned according to current  medical practices, but problems sometimes occur. Call your health care provider if you have any problems or questions after your procedure. °WHAT TO EXPECT AFTER THE PROCEDURE °· Blood in your urine will last from a few days to 3 weeks. You may have bleeding in the urine. °· After your catheter is removed, you will have burning (especially at the tip of your penis) when you urinate, especially at the end of urination. For the first few weeks after your procedure, you will feel the need to urinate often. °HOME CARE INSTRUCTIONS  °· Do not perform vigorous exercise, especially heavy lifting, for 1 week or as directed by your health care provider. °· Avoid sexual activity for 4 6 weeks or as directed by your   health care provider. °· Do not ride in a car for extended periods for 1 month or as directed by your health care provider. °· Do not strain to have a bowel movement. Drink a lot of fluids and and make sure you get enough fiber in your diet. °· Drink enough fluids to keep your urine clear or pale yellow. °SEEK IMMEDIATE MEDICAL CARE IF:  °· Your catheter has been removed and you are suddenly unable to urinate. °· Your catheter has not been removed, and it develops a blockage. °· You start to have blood clots in your urine. °· The blood in your urine becomes persistent or gets thick. °· Your temperature is greater than 100.5°F (38.1°C). °· You develop chest pains. °· You develop shortness of breath. °· You develop leg swelling or pain. °Document Released: 09/27/2005 Document Revised: 05/30/2013 Document Reviewed: 03/19/2013 °Exit[employer]® Patient Information ©2014 [employer]. ° °Post Anesthesia Home Care Instructions ° °Activity: °Get plenty of rest for the remainder of the day. A responsible adult should stay with you for 24 hours following the procedure.  °For the next 24 hours, DO NOT: °-Drive a car °-Operate machinery °-Drink alcoholic beverages °-Take any medication unless instructed by your physician °-Make any  legal decisions or sign important papers. ° °Meals: °Start with liquid foods such as gelatin or soup. Progress to regular foods as tolerated. Avoid greasy, spicy, heavy foods. If nausea and/or vomiting occur, drink only clear liquids until the nausea and/or vomiting subsides. Call your physician if vomiting continues. ° °Special Instructions/Symptoms: °Your throat may feel dry or sore from the anesthesia or the breathing tube placed in your throat during surgery. If this causes discomfort, gargle with warm salt water. The discomfort should disappear within 24 hours. ° °

## 2013-11-20 NOTE — Op Note (Signed)
Preoperative diagnosis: BPH, incomplete bladder emptying, weak stream Postop diagnosis: Same  Procedure: Exam under anesthesia,  Greenlight photo vaporization of the prostate  Surgeon: Junious Silk Anesthesia: Rose  Type of anesthesia: Gen.  Findings: On exam under anesthesia the penis was normal without mass or lesion, the testicles were descended bilaterally and were palpably normal without mass, the prostate was smooth and benign without hard area or nodule on digital rectal exam. I placed a B&O suppository.  Cystoscopy there was trilobar hypertrophy with tall lateral lobes from anterior to posterior as well as a median lobe. There was 100% visually occlusion. The bladder neck was elevated. The bladder appeared normal. There was mild trabeculation. There is no foreign body or stone in the bladder. The bladder mucosa appeared normal without tumor mass. The ureteral orifices were in normal orthotopic position with clear reflux.  Description of procedure: After consent was obtained patient brought to the operating room. After adequate anesthesia he is placed in lithotomy position and prepped and draped in the usual sterile fashion. Exam under anesthesia was performed. A B&O suppository placed. The laser scope was passed per urethra and the bladder examined. The laser fiber was deployed and I vaporized the lateral lobes anterior to posterior bladder neck to the area. I then increased the energy to 120 and vaporized the lateral lobes in the same fashion to create a good channel. The bladder neck was elevated and there was a median lobe present which was photographed. This was lasered primarily by aiming the laser lateral to medial and coming down toward the veru. This created a nice open channel and the scope move much easier through the prostate. The bladder was drained. One piece of the median lobe was enucleated by the precise vaporization and floated into the bladder. This piece was just too large to  drain by gravity through the scope and it was grasped with a grasper and removed without difficulty.  The ureteral orifices, bladder neck and the veru were continually referenced throughout the case to ensure vaporization of the BPH tissue only. The trigone and ureteral orifices were normal after all lasering. There was excellent hemostasis at low-pressure.  The bladder was filled and an 15 [place] coud catheter was placed without difficulty. The balloon was inflated and seated at the bladder neck. The urine was clear. The patient was awakened taken to recovery room in stable condition.  Complications: None Blood loss: Minimal Specimens: None  Drains: 18 French Foley  Disposition: Patient stable to PACU

## 2013-11-21 ENCOUNTER — Encounter (HOSPITAL_BASED_OUTPATIENT_CLINIC_OR_DEPARTMENT_OTHER): Payer: Self-pay | Admitting: Urology

## 2014-09-18 ENCOUNTER — Other Ambulatory Visit: Payer: Self-pay | Admitting: *Deleted

## 2014-09-18 MED ORDER — GABAPENTIN 600 MG PO TABS: ORAL_TABLET | ORAL | Status: DC

## 2014-12-19 ENCOUNTER — Ambulatory Visit (INDEPENDENT_AMBULATORY_CARE_PROVIDER_SITE_OTHER): Payer: BLUE CROSS/BLUE SHIELD | Admitting: Sports Medicine

## 2014-12-19 ENCOUNTER — Encounter: Payer: Self-pay | Admitting: Sports Medicine

## 2014-12-19 VITALS — BP 116/67

## 2014-12-19 DIAGNOSIS — M5137 Other intervertebral disc degeneration, lumbosacral region: Secondary | ICD-10-CM

## 2014-12-19 NOTE — Patient Instructions (Signed)
1. Hold dumbbells out on the side at 180 degrees and bend at the hips to the left and right (lateral bending). All exercises are 5 x and hold for 5 seconds.  2. As above, but rotate at the hips to the left and right.   3. Side plank (x5) on both sides. Lift hip off floor while putting support on elbow and feet.  4. Lay flat on the back and lift your arm above her head all the way back to 180 degrees. 30 seconds x3  5. Lay flat on back and put arms out to the side at 90 degrees. (making a T) 30 seconds x3  6. Use 5lb weight and do your serving motion. 3 set of 15.

## 2014-12-19 NOTE — Assessment & Plan Note (Signed)
See OV note  While he has made good compensation for degree of DDD and DJD we need to work on soft tissues

## 2014-12-19 NOTE — Progress Notes (Signed)
Subjective: HPI: Cleburn is a 59 year old with history of lumbar disc degeneration who presents today for left lower back pain when he is serving in tennis. He states that he is able to hit the ball with normal swings without a problem but when he reaches up high to serve, he notices the sharp pain in his left mid-to-lower back. At rest he has no pain. He has not had any numbness or tingling with this pain. He takes NSAIDs regularly before playing tennis and still has the back pain.   Uses gabapentin at night which has resolved issues with sciatic irritation  Objective: BP 116/67 mmHg  Physical Exam: Gen: Well-appearing, no acute distress. Sitting in chair in room. MSK: No tenderness to palpation of spine or bilateral muscles. Scoliosis of spine. Asymmetry between left and right back and shoulder muscles noted, with overall more spasm and hypertrophy on right than left. Decreased ROM of spine. Ext limited to 10 deg.  Lateral bend RT and LT to only 15 deg; mild limitation of forward flexion.  Rotation starts at thoracic and not lumbar spine. Neuro: No loss of sensation or strength.  Assessment/Plan: Keifer is a 59 year old with a history of lumbar disc degeneration who presents with left lower back pain with serves. He has asymmetry of his back, chronic muscle imbalance; so his pain is likely related to stretching/ strain of fascial attachments  of his muscles with the serve.  1. Complete exercises daily as described in patient instructions.  2. Continue Gabapentin 600mg  nightly. If having a day with increased activity or pain, may take 1.5 tablets (900mg ).  Patient was seen and discussed with my preceptor, Dr. Oneida Alar.  Freddrick March, MD [facility], PGY-1 12/19/2014  9:44 AM   Agree with plan and assessment

## 2015-04-30 ENCOUNTER — Other Ambulatory Visit: Payer: Self-pay | Admitting: Gastroenterology

## 2015-09-09 ENCOUNTER — Encounter (HOSPITAL_COMMUNITY): Payer: Self-pay | Admitting: *Deleted

## 2015-09-11 NOTE — Anesthesia Preprocedure Evaluation (Addendum)
Anesthesia Evaluation  Patient identified by MRN, date of birth, ID band Patient awake    Reviewed: Allergy & Precautions, NPO status , Patient's Chart, lab work & pertinent test results  Airway Mallampati: II   Neck ROM: Full    Dental  (+) Teeth Intact, Dental Advisory Given   Pulmonary neg pulmonary ROS,    breath sounds clear to auscultation       Cardiovascular negative cardio ROS   Rhythm:Regular     Neuro/Psych negative neurological ROS  negative psych ROS   GI/Hepatic negative GI ROS, Neg liver ROS,   Endo/Other  negative endocrine ROS  Renal/GU negative Renal ROS  negative genitourinary   Musculoskeletal negative musculoskeletal ROS (+) Arthritis , DJD,  Disk disease   Abdominal (+)  Abdomen: soft.    Peds negative pediatric ROS (+)  Hematology negative hematology ROS (+)   Anesthesia Other Findings   Reproductive/Obstetrics negative OB ROS                            Anesthesia Physical Anesthesia Plan  ASA: II  Anesthesia Plan: MAC   Post-op Pain Management:    Induction: Intravenous  Airway Management Planned: Nasal Cannula  Additional Equipment:   Intra-op Plan:   Post-operative Plan:   Informed Consent: I have reviewed the patients History and Physical, chart, labs and discussed the procedure including the risks, benefits and alternatives for the proposed anesthesia with the patient or authorized representative who has indicated his/her understanding and acceptance.     Plan Discussed with:   Anesthesia Plan Comments:         Anesthesia Quick Evaluation

## 2015-09-16 ENCOUNTER — Ambulatory Visit (HOSPITAL_COMMUNITY): Payer: BLUE CROSS/BLUE SHIELD | Admitting: Anesthesiology

## 2015-09-16 ENCOUNTER — Encounter (HOSPITAL_COMMUNITY)
Admission: RE | Disposition: A | Discharge status: Home / Self Care | Payer: Self-pay | Source: Ambulatory Visit | Attending: Gastroenterology

## 2015-09-16 ENCOUNTER — Ambulatory Visit (HOSPITAL_COMMUNITY)
Admission: RE | Admit: 2015-09-16 | Discharge: 2015-09-16 | Disposition: A | Discharge status: Home / Self Care | Payer: BLUE CROSS/BLUE SHIELD | Source: Ambulatory Visit | Attending: Gastroenterology | Admitting: Gastroenterology

## 2015-09-16 ENCOUNTER — Encounter (HOSPITAL_COMMUNITY): Payer: Self-pay

## 2015-09-16 DIAGNOSIS — Z1211 Encounter for screening for malignant neoplasm of colon: Secondary | ICD-10-CM | POA: Diagnosis present

## 2015-09-16 DIAGNOSIS — Z800 Family history of malignant neoplasm of digestive organs: Secondary | ICD-10-CM | POA: Insufficient documentation

## 2015-09-16 DIAGNOSIS — M25511 Pain in right shoulder: Secondary | ICD-10-CM | POA: Diagnosis not present

## 2015-09-16 DIAGNOSIS — G8929 Other chronic pain: Secondary | ICD-10-CM | POA: Diagnosis not present

## 2015-09-16 DIAGNOSIS — D122 Benign neoplasm of ascending colon: Secondary | ICD-10-CM | POA: Insufficient documentation

## 2015-09-16 DIAGNOSIS — M79671 Pain in right foot: Secondary | ICD-10-CM | POA: Diagnosis not present

## 2015-09-16 DIAGNOSIS — N400 Enlarged prostate without lower urinary tract symptoms: Secondary | ICD-10-CM | POA: Insufficient documentation

## 2015-09-16 DIAGNOSIS — M1990 Unspecified osteoarthritis, unspecified site: Secondary | ICD-10-CM | POA: Insufficient documentation

## 2015-09-16 DIAGNOSIS — M25562 Pain in left knee: Secondary | ICD-10-CM | POA: Insufficient documentation

## 2015-09-16 HISTORY — PX: COLONOSCOPY WITH PROPOFOL: SHX5780

## 2015-09-16 SURGERY — COLONOSCOPY WITH PROPOFOL
Anesthesia: Monitor Anesthesia Care

## 2015-09-16 MED ORDER — FENTANYL CITRATE (PF) 100 MCG/2ML IJ SOLN: 25.0000 ug | INTRAMUSCULAR | Status: DC | PRN

## 2015-09-16 MED ORDER — PROPOFOL 10 MG/ML IV BOLUS
INTRAVENOUS | Status: AC
  Filled 2015-09-16: qty 40

## 2015-09-16 MED ORDER — SODIUM CHLORIDE 0.9 % IV SOLN: INTRAVENOUS | Status: DC

## 2015-09-16 MED ORDER — PROPOFOL 500 MG/50ML IV EMUL
INTRAVENOUS | Status: DC | PRN
  Administered 2015-09-16: 100 ug/kg/min via INTRAVENOUS

## 2015-09-16 MED ORDER — LACTATED RINGERS IV SOLN
INTRAVENOUS | Status: DC
  Administered 2015-09-16: 11:00:00 via INTRAVENOUS

## 2015-09-16 MED ORDER — PROMETHAZINE HCL 25 MG/ML IJ SOLN: 6.2500 mg | INTRAMUSCULAR | Status: DC | PRN

## 2015-09-16 MED ORDER — PROPOFOL 500 MG/50ML IV EMUL
INTRAVENOUS | Status: DC | PRN
  Administered 2015-09-16: 40 mg via INTRAVENOUS
  Administered 2015-09-16: 30 mg via INTRAVENOUS

## 2015-09-16 MED ORDER — EPHEDRINE SULFATE 50 MG/ML IJ SOLN
INTRAMUSCULAR | Status: AC
Start: 2015-09-16 — End: 2015-09-16
  Filled 2015-09-16: qty 2

## 2015-09-16 MED ORDER — SODIUM CHLORIDE 0.9 % IJ SOLN
INTRAMUSCULAR | Status: AC
  Filled 2015-09-16: qty 20

## 2015-09-16 MED ORDER — MEPERIDINE HCL 100 MG/ML IJ SOLN: 6.2500 mg | INTRAMUSCULAR | Status: DC | PRN

## 2015-09-16 SURGICAL SUPPLY — 22 items

## 2015-09-16 NOTE — Op Note (Signed)
Procedure: Surveillance colonoscopy. Father diagnosed with colon cancer at age 58. Sister diagnosed with colon cancer at age 52.   Endoscopist: Earle Gell  Premedication: Propofol administered by anesthesia  Procedure: The patient was placed in the left lateral decubitus position. Anal inspection and digital rectal exam were normal. The Pentax pediatric colonoscope was introduced into the rectum and advanced to the cecum. A normal-appearing appendiceal orifice and ileocecal valve were identified. Colonic preparation for the exam today was good. Withdrawal time was 14 minutes  Rectum. Normal. Retroflex view of the distal rectum was normal  Sigmoid colon and descending colon. Normal  Splenic flexure. Normal  Transverse colon. Normal  Hepatic flexure. Normal  Ascending colon. From the distal ascending colon, a 2 mm sessile polyp was removed with the cold biopsy forceps  Cecum and ileocecal valve. Normal  Assessment: A diminutive polyp was removed from the distal ascending colon. Otherwise normal surveillance colonoscopy.  Recommendation: Schedule repeat surveillance colonoscopy in 5 years

## 2015-09-16 NOTE — Anesthesia Postprocedure Evaluation (Signed)
Anesthesia Post Note  Patient: George Ramos  Procedure(s) Performed: Procedure(s) (LRB): COLONOSCOPY WITH PROPOFOL (N/A)  Patient location during evaluation: PACU Anesthesia Type: MAC Level of consciousness: awake and alert Pain management: pain level controlled Vital Signs Assessment: post-procedure vital signs reviewed and stable Respiratory status: spontaneous breathing, nonlabored ventilation, respiratory function stable and patient connected to nasal cannula oxygen Cardiovascular status: stable and blood pressure returned to baseline Anesthetic complications: no    Last Vitals:  Filed Vitals:   09/16/15 1038  BP: 136/74  Pulse: 46  Temp: 36.6 C  Resp: 13    Last Pain: There were no vitals filed for this visit.               Kyriaki Moder

## 2015-09-16 NOTE — Discharge Instructions (Signed)

## 2015-09-16 NOTE — Transfer of Care (Signed)
Immediate Anesthesia Transfer of Care Note  Patient: George Ramos  Procedure(s) Performed: Procedure(s): COLONOSCOPY WITH PROPOFOL (N/A)  Patient Location: PACU  Anesthesia Type:MAC  Level of Consciousness: sedated, patient cooperative and responds to stimulation  Airway & Oxygen Therapy: Patient Spontanous Breathing and Patient connected to face mask oxygen  Post-op Assessment: Report given to RN and Post -op Vital signs reviewed and stable  Post vital signs: Reviewed and stable  Last Vitals:  Filed Vitals:   09/16/15 1038  BP: 136/74  Pulse: 46  Temp: 36.6 C  Resp: 13    Complications: No apparent anesthesia complications

## 2015-09-16 NOTE — H&P (Signed)
  Procedure: Surveillance colonoscopy. 2013 colonoscopy performed with removal of adenomatous colon polyps. Father diagnosed with colon cancer in his 85s. Sr. diagnosed with colon cancer under the age of 52.  History: The patient is a 59 year old male born July 17, 1956. He is scheduled to undergo a surveillance colonoscopy today.  Medication allergies: None  Past medical history: Herniorrhaphy. Scoliosis.  Family history: Father diagnosed with colon cancer at age 54 and Sister diagnosed with colon cancer at age 1  Exam: The patient is alert and lying comfortably on the endoscopy stretcher. Abdomen is soft and nontender to palpation. Lungs are clear to auscultation. Cardiac exam reveals a regular rhythm.  Plan: Proceed with surveillance colonoscopy

## 2015-09-17 ENCOUNTER — Encounter (HOSPITAL_COMMUNITY): Payer: Self-pay | Admitting: Gastroenterology

## 2015-11-14 ENCOUNTER — Other Ambulatory Visit: Payer: Self-pay | Admitting: *Deleted

## 2015-11-17 ENCOUNTER — Telehealth: Payer: Self-pay | Admitting: *Deleted

## 2015-11-17 ENCOUNTER — Other Ambulatory Visit: Payer: Self-pay | Admitting: *Deleted

## 2015-11-17 MED ORDER — GABAPENTIN 600 MG PO TABS: 600.0000 mg | ORAL_TABLET | Freq: Every day | ORAL | Status: DC

## 2015-11-17 NOTE — Telephone Encounter (Signed)
Spoke to pt about dose of gabapentin.  He takes one 600mg  tablet at night.  I updated his script and sent the refill request to his pharmacy.

## 2016-03-13 DIAGNOSIS — R21 Rash and other nonspecific skin eruption: Secondary | ICD-10-CM | POA: Diagnosis not present

## 2016-03-13 DIAGNOSIS — W57XXXA Bitten or stung by nonvenomous insect and other nonvenomous arthropods, initial encounter: Secondary | ICD-10-CM | POA: Diagnosis not present

## 2016-04-27 DIAGNOSIS — L821 Other seborrheic keratosis: Secondary | ICD-10-CM | POA: Diagnosis not present

## 2016-04-27 DIAGNOSIS — L814 Other melanin hyperpigmentation: Secondary | ICD-10-CM | POA: Diagnosis not present

## 2016-04-27 DIAGNOSIS — D1801 Hemangioma of skin and subcutaneous tissue: Secondary | ICD-10-CM | POA: Diagnosis not present

## 2016-05-19 ENCOUNTER — Ambulatory Visit (INDEPENDENT_AMBULATORY_CARE_PROVIDER_SITE_OTHER): Payer: BLUE CROSS/BLUE SHIELD | Admitting: Sports Medicine

## 2016-05-19 DIAGNOSIS — M25532 Pain in left wrist: Secondary | ICD-10-CM

## 2016-05-19 MED ORDER — NITROGLYCERIN 0.2 MG/HR TD PT24: MEDICATED_PATCH | TRANSDERMAL | 1 refills | Status: DC

## 2016-05-19 NOTE — Patient Instructions (Signed)

## 2016-05-20 DIAGNOSIS — M25532 Pain in left wrist: Secondary | ICD-10-CM | POA: Insufficient documentation

## 2016-05-20 NOTE — Assessment & Plan Note (Signed)
Most likely the pain is related to his TFCC. Ligaments and vessels are normal in appearance.  - advised that he will have to changes the way he hits his backhand in order to avoid flaring this up from time to time.  - try nitro patch and he will follow up in 6 weeks.

## 2016-05-20 NOTE — Progress Notes (Signed)
  George Ramos - 60 y.o. male MRN [id number]  Date of birth: September 24, 1956  SUBJECTIVE:  Including CC & ROS.  CC: Left wrist pain   George Ramos is a 60 yo male that is presenting with ulnar sided left wrist pain. This started about 4-6 weeks ago. He most ly notices the pain when he is hitting a double handed back hand.  He has wearing a brace some improvement. The pain is sharp and he mostly only notices it when he is playing tennis. He denies any prior injury to his wrist.   ROS: No unexpected weight loss, fever, chills, swelling, instability, muscle pain, numbness/tingling, redness, otherwise see HPI    HISTORY: Past Medical, Surgical, Social, and Family History Reviewed & Updated per EMR.   Pertinent Historical Findings include: PMSHx -  Ruptured disc  PSHx -  No tobacco use, occasional EtOH  FHx -  DM2 Medications - gabapentin   DATA REVIEWED: None to review  PHYSICAL EXAM:  VS: BP:107/73  HR: bpm  TEMP: ( )  RESP:   HT:5\' 7"  (170.2 cm)   WT:140 lb (63.5 kg)  BMI:22 PHYSICAL EXAM: Gen: NAD, alert, cooperative with exam, well-appearing HEENT: clear conjunctiva, EOMI CV:  no edema, capillary refill brisk,  Resp: non-labored, normal speech Skin: no rashes, normal turgor  Neuro: no gross deficits.  Psych:  alert and oriented Hand and Wrist:  No signs of atrophy of the thenar or hypothenar eminence Normal wrist range of motion in flexion, extension, ulnar and radial deviation. No finger misalignment or malrotation Normal thumb opposition. Normal finger abduction and abduction. Normal grip strength. Pain reproduces with compression of TFCC Neurovascularly intact  Limited US: left wrist: ulnar artery view and patent. The ECU was viewed in short and long axis and normal appearing. The TFCC was limited in view but appears to have hypoechoic change that would suggest a tear. Also hyperechoic changes in the TFCC that appear to be calcium deposits.    ASSESSMENT & PLAN:    Left wrist pain Most likely the pain is related to his TFCC. Ligaments and vessels are normal in appearance.  - advised that he will have to changes the way he hits his backhand in order to avoid flaring this up from time to time.  - try nitro patch and he will follow up in 6 weeks.

## 2016-06-20 DIAGNOSIS — S30863A Insect bite (nonvenomous) of scrotum and testes, initial encounter: Secondary | ICD-10-CM | POA: Diagnosis not present

## 2016-06-20 DIAGNOSIS — W57XXXA Bitten or stung by nonvenomous insect and other nonvenomous arthropods, initial encounter: Secondary | ICD-10-CM | POA: Diagnosis not present

## 2016-06-20 DIAGNOSIS — S40262A Insect bite (nonvenomous) of left shoulder, initial encounter: Secondary | ICD-10-CM | POA: Diagnosis not present

## 2016-06-30 ENCOUNTER — Ambulatory Visit (INDEPENDENT_AMBULATORY_CARE_PROVIDER_SITE_OTHER): Payer: BLUE CROSS/BLUE SHIELD | Admitting: Sports Medicine

## 2016-06-30 ENCOUNTER — Encounter: Payer: Self-pay | Admitting: Sports Medicine

## 2016-06-30 DIAGNOSIS — M25532 Pain in left wrist: Secondary | ICD-10-CM

## 2016-07-01 NOTE — Progress Notes (Signed)
  George Ramos - 60 y.o. male MRN [id number]  Date of birth: [birth year]  SUBJECTIVE:  Including CC & ROS.  Chief Complaint  Patient presents with  . Wrist Pain    George Ramos is a 60 year old male that is following up for his left wrist pain. He was diagnosed with a TFCC partial tear. He has augmented his grip while playing tennis to help with the pain. He was doing well until yesterday he was doing handgrip exercises that exacerbated the pain. He also has been dealing with a tick bite for which she is taking antibiotics for. He has been having some flulike symptoms. He reports overall that he is feeling much better in terms of his wrist pain and is playing tennis again.  ROS: No unexpected weight loss, fever, chills, swelling, instability, muscle pain, numbness/tingling, redness, otherwise see HPI   HISTORY: Past Medical, Surgical, Social, and Family History Reviewed & Updated per EMR.   Pertinent Historical Findings include: PMSHx -  Ruptured disc   DATA REVIEWED: None to review   PHYSICAL EXAM:  VS: BP:111/64  HR: bpm  TEMP: ( )  RESP:   HT:5\' 7"  (170.2 cm)   WT:140 lb (63.5 kg)  BMI:22 PHYSICAL EXAM: Gen: NAD, alert, cooperative with exam, well-appearing HEENT: clear conjunctiva, EOMI CV:  no edema, capillary refill brisk,  Resp: non-labored, normal speech Skin: no rashes, normal turgor  Neuro: no gross deficits.  Psych:  alert and oriented Left wrist:  Minimal tenderness to palpation over the left TFCC. Normal range of motion in all movements. Normal strength resistance in all movements. Normal pincer grasp. Normal grip strength. Neurovascular intact.  ASSESSMENT & PLAN:   Left wrist pain Improving  - Advised to be careful with the specific exercises that he is doing. - Also gave considerations with how to hit in tennis - Advised to follow-up as needed. - He may follow-up for a 1 week prior to a Tennis Event in order to receive an injection.

## 2016-07-01 NOTE — Assessment & Plan Note (Addendum)
Improving  - Advised to be careful with the specific exercises that he is doing. - Also gave considerations with how to hit in tennis - Advised to follow-up as needed. - He may follow-up for a 1 week prior to a Tennis Event in order to receive an injection.

## 2016-07-27 ENCOUNTER — Encounter: Payer: Self-pay | Admitting: Family Medicine

## 2016-07-27 ENCOUNTER — Ambulatory Visit (INDEPENDENT_AMBULATORY_CARE_PROVIDER_SITE_OTHER): Payer: BLUE CROSS/BLUE SHIELD | Admitting: Family Medicine

## 2016-07-27 VITALS — BP 126/52 | Ht 67.0 in | Wt 140.0 lb

## 2016-07-27 DIAGNOSIS — M25522 Pain in left elbow: Secondary | ICD-10-CM | POA: Diagnosis not present

## 2016-07-27 MED ORDER — METHYLPREDNISOLONE ACETATE 40 MG/ML IJ SUSP
20.0000 mg | Freq: Once | INTRAMUSCULAR | Status: AC
  Administered 2016-07-27: 20 mg via INTRA_ARTICULAR

## 2016-07-28 DIAGNOSIS — M25522 Pain in left elbow: Secondary | ICD-10-CM | POA: Insufficient documentation

## 2016-07-28 NOTE — Assessment & Plan Note (Signed)
Pain is most likely related to medial epicondylitis. He has a tournament this Friday would like to have relief of it. - Injection performed today - Advised to follow-up in 3-4 weeks if no improvement. - Previously is not been able to tolerate nitroglycerin patches.

## 2016-07-28 NOTE — Progress Notes (Signed)
  George Ramos - 60 y.o. male MRN [id number]  Date of birth: 1956-04-04  SUBJECTIVE:  Including CC & ROS.  Chief Complaint  Patient presents with  . Elbow Pain    George Ramos is a 60 yo M That is presenting with left elbow pain. This is occurring on the medial aspect. It started recently since he has changed his stroke secondary to pain in his wrist. He notices it when he is slicing and tenderness. He feels like the pain is gotten worse. It has increased intensity and frequency. It started in a gradual sensation. He has a tennis tournament this Friday.  ROS: No unexpected weight loss, fever, chills, swelling, instability, muscle pain, numbness/tingling, redness, otherwise see HPI   HISTORY: Past Medical, Surgical, Social, and Family History Reviewed & Updated per EMR.   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   DATA REVIEWED: None to review   PHYSICAL EXAM:  VS: BP:(!) 126/52  HR: bpm  TEMP: ( )  RESP:   HT:5\' 7"  (170.2 cm)   WT:140 lb (63.5 kg)  BMI:22 PHYSICAL EXAM: Gen: NAD, alert, cooperative with exam, well-appearing HEENT: clear conjunctiva, EOMI CV:  no edema, capillary refill brisk,  Resp: non-labored, normal speech Skin: no rashes, normal turgor  Neuro: no gross deficits.  Psych:  alert and oriented Elbow: Unremarkable to inspection. Range of motion full pronation, supination, flexion, extension. Strength is full to all of the above directions Pain to palpation over the medial condyle. Pain with resistance to wrist flexion.  Limited ultrasound: Left elbow: There is a spur at the medial epicondyles of the left elbow. There is no partial tears of the flexors. There is no significant vascularity.   Aspiration/Injection Procedure Note George Ramos 1955/11/05  Procedure: Injection Indications: Medial epicondylitis of the left elbow  Procedure Details Consent: Risks of procedure as well as the alternatives and risks of each  were explained to the (patient/caregiver).  Consent for procedure obtained. Time Out: Verified patient identification, verified procedure, site/side was marked, verified correct patient position, special equipment/implants available, medications/allergies/relevent history reviewed, required imaging and test results available.  Performed.  The area was cleaned with iodine and alcohol swabs.    The left elbow medial epicondyles was injected using 0.5 cc's of 40 mg Depomedrol and 1.5 cc's of 1% lidocaine with a 25 1 1/2" needle.  Ultrasound was used. Images were not obtained.    A sterile dressing was applied.  Patient did tolerate procedure well.  ASSESSMENT & PLAN:   Left elbow pain Pain is most likely related to medial epicondylitis. He has a tournament this Friday would like to have relief of it. - Injection performed today - Advised to follow-up in 3-4 weeks if no improvement. - Previously is not been able to tolerate nitroglycerin patches.

## 2016-10-07 DIAGNOSIS — H5203 Hypermetropia, bilateral: Secondary | ICD-10-CM | POA: Diagnosis not present

## 2016-10-07 DIAGNOSIS — H524 Presbyopia: Secondary | ICD-10-CM | POA: Diagnosis not present

## 2016-10-19 ENCOUNTER — Other Ambulatory Visit: Payer: Self-pay | Admitting: *Deleted

## 2016-10-19 MED ORDER — GABAPENTIN 600 MG PO TABS
600.0000 mg | ORAL_TABLET | Freq: Every day | ORAL | 6 refills | Status: DC
Start: 2016-10-19 — End: 2017-03-30

## 2016-11-09 DIAGNOSIS — Z23 Encounter for immunization: Secondary | ICD-10-CM | POA: Diagnosis not present

## 2017-01-06 ENCOUNTER — Encounter: Payer: Self-pay | Admitting: Sports Medicine

## 2017-01-06 ENCOUNTER — Ambulatory Visit (INDEPENDENT_AMBULATORY_CARE_PROVIDER_SITE_OTHER): Payer: BLUE CROSS/BLUE SHIELD | Admitting: Sports Medicine

## 2017-01-06 ENCOUNTER — Ambulatory Visit: Payer: Self-pay

## 2017-01-06 VITALS — BP 101/62 | Ht 67.0 in | Wt 145.0 lb

## 2017-01-06 DIAGNOSIS — M25511 Pain in right shoulder: Secondary | ICD-10-CM

## 2017-01-06 DIAGNOSIS — M7702 Medial epicondylitis, left elbow: Secondary | ICD-10-CM

## 2017-01-06 MED ORDER — NITROGLYCERIN 0.2 MG/HR TD PT24
MEDICATED_PATCH | TRANSDERMAL | 1 refills | Status: DC
Start: 2017-01-06 — End: 2017-11-03

## 2017-01-06 NOTE — Assessment & Plan Note (Signed)
I think he has had multiple rotator cuff tendon injuries over the years  This is actually pretty stable at present  I suggested specific rotator cuff exercises and tennis serve exercises

## 2017-01-06 NOTE — Assessment & Plan Note (Signed)
A home exercise regimen  Consider another trial of nitroglycerin patches  He may need to make modifications in his tennis stroke  Recheck if not resolving in 2 months

## 2017-01-06 NOTE — Progress Notes (Signed)
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 Palpation is normal with no tenderness over AC joint or bicipital groove. ROM is full in all planes. Rotator cuff strength normal throughout. No signs of impingement with negative Neer and Hawkin's tests, empty can. Speeds and Yergason's tests normal. No labral pathology noted with negative Obrien's, negative clunk and good stability. Normal scapular function observed. No painful arc and no drop arm sign. No apprehension sign  After supraspinatous testing he gets some pain  Neck shows limited extension Limited right flexion Limited right rotation Overall motion is decreased  Left elbow shows full range of motion No swelling TTP over medial epicondyle  Ultrasound of right shoulder  Appears benign a shows a very small focal tear of about 3 mm with hypoechoic change This looks chronic and there is no generalized hypoechoic change in the tendon Infraspinatus and teres minor are normal Subscapularis is normal A.c. Joint has some mild irregularity  Impression consistent with rotator cuff  tendinopathy with a minor tear in the supraspinatous tendon  Ultrasound left medial elbow The medial epicondyles shows no spurring There is no joint effusion There are a couple small areas hypoechoic change Near attachment there is a specific area With about 1-1-1/2 cm hypoechoic change, increased Doppler flow and neo- vessels  Findings consistent with medial epicondylitis with inflammation but no tendon tear  Ultrasound and interpretation by Wolfgang Phoenix. Oneida Alar, MD

## 2017-01-11 ENCOUNTER — Ambulatory Visit: Payer: BLUE CROSS/BLUE SHIELD | Admitting: Sports Medicine

## 2017-02-23 DIAGNOSIS — H16041 Marginal corneal ulcer, right eye: Secondary | ICD-10-CM | POA: Diagnosis not present

## 2017-02-24 DIAGNOSIS — H16041 Marginal corneal ulcer, right eye: Secondary | ICD-10-CM | POA: Diagnosis not present

## 2017-03-03 ENCOUNTER — Ambulatory Visit: Payer: BLUE CROSS/BLUE SHIELD | Admitting: Sports Medicine

## 2017-03-30 ENCOUNTER — Other Ambulatory Visit: Payer: Self-pay | Admitting: *Deleted

## 2017-03-30 MED ORDER — GABAPENTIN 600 MG PO TABS: 600.0000 mg | ORAL_TABLET | Freq: Every day | ORAL | 3 refills | Status: DC

## 2017-04-05 ENCOUNTER — Ambulatory Visit: Payer: BLUE CROSS/BLUE SHIELD | Admitting: Sports Medicine

## 2017-04-26 ENCOUNTER — Ambulatory Visit: Payer: BLUE CROSS/BLUE SHIELD | Admitting: Sports Medicine

## 2017-05-17 ENCOUNTER — Ambulatory Visit: Payer: BLUE CROSS/BLUE SHIELD | Admitting: Sports Medicine

## 2017-06-16 ENCOUNTER — Ambulatory Visit: Payer: BLUE CROSS/BLUE SHIELD | Admitting: Sports Medicine

## 2017-06-27 DIAGNOSIS — L814 Other melanin hyperpigmentation: Secondary | ICD-10-CM | POA: Diagnosis not present

## 2017-06-27 DIAGNOSIS — L821 Other seborrheic keratosis: Secondary | ICD-10-CM | POA: Diagnosis not present

## 2017-06-27 DIAGNOSIS — D1801 Hemangioma of skin and subcutaneous tissue: Secondary | ICD-10-CM | POA: Diagnosis not present

## 2017-06-27 DIAGNOSIS — D225 Melanocytic nevi of trunk: Secondary | ICD-10-CM | POA: Diagnosis not present

## 2017-07-28 ENCOUNTER — Ambulatory Visit: Payer: BLUE CROSS/BLUE SHIELD | Admitting: Sports Medicine

## 2017-11-03 ENCOUNTER — Encounter: Payer: Self-pay | Admitting: Sports Medicine

## 2017-11-03 ENCOUNTER — Ambulatory Visit: Payer: BLUE CROSS/BLUE SHIELD | Admitting: Sports Medicine

## 2017-11-03 DIAGNOSIS — M25562 Pain in left knee: Secondary | ICD-10-CM

## 2017-11-03 DIAGNOSIS — M25561 Pain in right knee: Secondary | ICD-10-CM | POA: Insufficient documentation

## 2017-11-03 NOTE — Assessment & Plan Note (Signed)
This is primarily occupational Pressure and probably some bruising of retropatellar cartilage  Suggested some HEP Icing after activity Prn Aleve  Modify work activity  Can try patellar strap if this persists

## 2017-11-03 NOTE — Progress Notes (Signed)
CC: Bilateral anterior knee pain  Patient works in Therapist, occupational Pressure on Left knee after kneeling in crawl space Now unable to knee lon left knee 2/2 pain Knee pads help somewhat  RT Knee - sometimes kneels on this one Has more pain with sport Still anterior and infereior location  ROS No swelling No locking No giving way  PE NAD, WD, muscular male BP 110/70   Ht 5\' 7"  (1.702 m)   Wt 140 lb (63.5 kg)   BMI 21.93 kg/m   Knee: RT and LT Normal to inspection with no erythema or effusion or obvious bony abnormalities. Palpation normal with no warmth, joint line tenderness, patellar tenderness, or condyle tenderness. ROM full in flexion and extension and lower leg rotation. Ligaments with solid consistent endpoints including ACL, PCL, LCL, MCL. Negative Mcmurray's, Apley's, and Thessalonian tests. Non painful patellar compression. Patellar glide without crepitus. Patellar and quadriceps tendons unremarkable. Hamstring and quadriceps strength is normal.   Note he has 160 deg of flexion bilat and feels tightness under patella bilaterally with full flexion  Ultrasound of bilateral knees No swelling in SPP Medial and Lateral meniscus looks normal Patellar tendon normal on RT and mildly thick on left with some hypoechoic change Trochlear groove bilat shows good space Mild swelling under the distal patellar pole  Impression: Ultrasound of knees shows no arthritis or soft tissue injury  Ultrasound and interpretation by Wolfgang Phoenix. Oneida Alar, MD

## 2017-11-16 ENCOUNTER — Other Ambulatory Visit: Payer: Self-pay | Admitting: *Deleted

## 2017-11-16 MED ORDER — MELOXICAM 15 MG PO TABS: 15.0000 mg | ORAL_TABLET | Freq: Every day | ORAL | 2 refills | Status: DC

## 2017-11-24 ENCOUNTER — Other Ambulatory Visit: Payer: Self-pay | Admitting: *Deleted

## 2017-11-24 MED ORDER — GABAPENTIN 600 MG PO TABS: 600.0000 mg | ORAL_TABLET | Freq: Every day | ORAL | 3 refills | Status: DC

## 2018-01-10 DIAGNOSIS — M9903 Segmental and somatic dysfunction of lumbar region: Secondary | ICD-10-CM | POA: Diagnosis not present

## 2018-01-10 DIAGNOSIS — M5432 Sciatica, left side: Secondary | ICD-10-CM | POA: Diagnosis not present

## 2018-01-10 DIAGNOSIS — M9901 Segmental and somatic dysfunction of cervical region: Secondary | ICD-10-CM | POA: Diagnosis not present

## 2018-01-11 DIAGNOSIS — M9901 Segmental and somatic dysfunction of cervical region: Secondary | ICD-10-CM | POA: Diagnosis not present

## 2018-01-11 DIAGNOSIS — M9903 Segmental and somatic dysfunction of lumbar region: Secondary | ICD-10-CM | POA: Diagnosis not present

## 2018-01-11 DIAGNOSIS — M5432 Sciatica, left side: Secondary | ICD-10-CM | POA: Diagnosis not present

## 2018-01-12 ENCOUNTER — Ambulatory Visit: Payer: BLUE CROSS/BLUE SHIELD | Admitting: Sports Medicine

## 2018-01-12 ENCOUNTER — Encounter: Payer: Self-pay | Admitting: Sports Medicine

## 2018-01-12 DIAGNOSIS — M9901 Segmental and somatic dysfunction of cervical region: Secondary | ICD-10-CM | POA: Diagnosis not present

## 2018-01-12 DIAGNOSIS — M5136 Other intervertebral disc degeneration, lumbar region: Secondary | ICD-10-CM | POA: Diagnosis not present

## 2018-01-12 DIAGNOSIS — M9903 Segmental and somatic dysfunction of lumbar region: Secondary | ICD-10-CM | POA: Diagnosis not present

## 2018-01-12 DIAGNOSIS — M5126 Other intervertebral disc displacement, lumbar region: Secondary | ICD-10-CM | POA: Diagnosis not present

## 2018-01-12 DIAGNOSIS — M5432 Sciatica, left side: Secondary | ICD-10-CM | POA: Diagnosis not present

## 2018-01-12 MED ORDER — PREDNISONE 20 MG PO TABS: 20.0000 mg | ORAL_TABLET | Freq: Two times a day (BID) | ORAL | 0 refills | Status: DC

## 2018-01-12 MED ORDER — GABAPENTIN 600 MG PO TABS: 600.0000 mg | ORAL_TABLET | Freq: Three times a day (TID) | ORAL | 1 refills | Status: DC

## 2018-01-12 NOTE — Assessment & Plan Note (Signed)
Patient is here with signs and symptoms very concerning for acute herniation of lumbar disc.  No current red flag symptoms however symptoms are severe and concerning.  Patient would like to treat conservatively if possible. -Prednisone 20 mg twice daily times 7 days. -Increase gabapentin dosing to 3 times daily (600 mg) -Gentle range of motion and short walking at home.  Use pain as guide. -OTC NSAIDs as needed. -Very strict return precautions discussed focusing on worsening persistent weakness and/or bowel/bladder incontinence. -Follow-up 1 week

## 2018-01-12 NOTE — Progress Notes (Signed)
   HPI  CC: Acute low back pain Patient is here with symptoms of acute onset low back pain.  He states that his pain began 4 days ago  Pain is described as aching and sharp with radiation primarily down his left leg with associated numbness over the dorsal aspect of his left foot.  He denies any weakness or paresthesias.  He denies any injury trauma or event which may have caused this.  He does state that on Friday and Saturday leading up to this back pain he was playing tennis.  He had noticed some slight discomfort in his back beginning Friday but the pain noted on Sunday has been relatively intolerable.  Patient compares his current discomfort to that which she had experienced when he had previously ruptured a disc at the L5-S1 level.  Pain is much worse with back extension, as he states he is unable to lay straight back in bed and must raise his legs in a flexed position if he is to lay on his back. He denies any bowel or bladder incontinence.  Patient is able to ambulate but very cautiously.  Medications/Interventions Tried: none  See HPI and/or previous note for associated ROS.  Objective: BP 110/67   Ht 5\' 7"  (1.702 m)   Wt 145 lb (65.8 kg)   BMI 22.71 kg/m  Gen: NAD, well groomed, a/o x3, normal affect.  CV: Well-perfused. Warm.  Resp: Non-labored.  Neuro: Sensation intact throughout. No gross coordination deficits.  Gait: Nonpathologic posture, antalgic gait w/ back flexion and hip flexion. Shortened strides, without signs of limp or balance issues. Back: Inspection yields no evidence of erythema, ecchymosis, or bony deformity.  Tenderness to palpation along bilateral L5-S1 region (L>R).  Discomfort extends primarily across the left side into the buttock and down the posterior aspect of the left leg.  Strength is intact bilaterally.  Sensation slightly diminished on the left side compared to the right.  No leg swelling.  +1 SLR left side.  +2 DTRs bilaterally.  Assessment and  plan:  Degeneration of lumbar intervertebral disc with acute herniation Patient is here with signs and symptoms very concerning for acute herniation of lumbar disc.  No current red flag symptoms however symptoms are severe and concerning.  Patient would like to treat conservatively if possible. -Prednisone 20 mg twice daily times 7 days. -Increase gabapentin dosing to 3 times daily (600 mg) -Gentle range of motion and short walking at home.  Use pain as guide. -OTC NSAIDs as needed. -Very strict return precautions discussed focusing on worsening persistent weakness and/or bowel/bladder incontinence. -Follow-up 1 week   Meds ordered this encounter  Medications  . predniSONE (DELTASONE) 20 MG tablet    Sig: Take 1 tablet (20 mg total) by mouth 2 (two) times daily.    Dispense:  14 tablet    Refill:  0  . gabapentin (NEURONTIN) 600 MG tablet    Sig: Take 1 tablet (600 mg total) by mouth 3 (three) times daily.    Dispense:  90 tablet    Refill:  1     Elberta Leatherwood, MD,MS [facility] Fellow 01/12/2018 6:34 PM   I observed and examined the patient with the resident and agree with assessment and plan.  Note reviewed and modified by me. Grace Bushy

## 2018-01-19 ENCOUNTER — Ambulatory Visit (INDEPENDENT_AMBULATORY_CARE_PROVIDER_SITE_OTHER): Payer: BLUE CROSS/BLUE SHIELD | Admitting: Sports Medicine

## 2018-01-19 ENCOUNTER — Encounter: Payer: Self-pay | Admitting: Sports Medicine

## 2018-01-19 DIAGNOSIS — M5126 Other intervertebral disc displacement, lumbar region: Secondary | ICD-10-CM | POA: Diagnosis not present

## 2018-01-19 DIAGNOSIS — M5136 Other intervertebral disc degeneration, lumbar region: Secondary | ICD-10-CM | POA: Diagnosis not present

## 2018-01-19 NOTE — Assessment & Plan Note (Signed)
Based on examination he is still very likely to have a disc herniation with his pain pattern and very positive straight leg raise  I suggested walking and more moderate exercise Continue gabapentin 600 3 times daily As needed ibuprofen  Return to clinic in 3 weeks or if any worsening of symptoms

## 2018-01-19 NOTE — Progress Notes (Signed)
Follow-up low back pain and presumed disc herniation  Patient returns after 1 week of prednisone for presumed disc herniation. He feels that he is 75% improved. Numbness in the left foot has resolved. Back pain is now present only with certain positions. He still gets pain if he tries to elevate his left or right leg. He has resumed some walking and has gone back to work. He actually hit tennis strokes yesterday and had some soreness afterwards but no worsening of pain.  Since last visit he has also increased his gabapentin to 600 3 times daily This seemed to lessen the radiating pain. He has used some ibuprofen but not a lot.  Past history is pertinent in that he has had cervical spine disease and he also had a herniated disc documented MRI about 10 years ago in the lumbar spine   Review of systems No bowel or bladder problems No foot drop Feels much less weakness in the left leg than he did last week   physical examination Muscular older male in no acute distress BP 112/68   Ht 5\' 7"  (1.702 m)   Wt 145 lb (65.8 kg)   BMI 22.71 kg/m  Walking today without a limp Able to do heel and toe walk Tandem walk today does not cause him to lose his balance Flexion and lateral band are nonpainful Low back pain on 20 degrees of back extension Straight leg raise on the right causes severe pain in the left low back Straight leg raise on the left causes mild pain in the left low back Strength testing in both lower extremities was normal

## 2018-01-20 DIAGNOSIS — H524 Presbyopia: Secondary | ICD-10-CM | POA: Diagnosis not present

## 2018-01-20 DIAGNOSIS — H5203 Hypermetropia, bilateral: Secondary | ICD-10-CM | POA: Diagnosis not present

## 2018-01-24 DIAGNOSIS — Z23 Encounter for immunization: Secondary | ICD-10-CM | POA: Diagnosis not present

## 2018-01-24 DIAGNOSIS — Z1159 Encounter for screening for other viral diseases: Secondary | ICD-10-CM | POA: Diagnosis not present

## 2018-01-24 DIAGNOSIS — Z125 Encounter for screening for malignant neoplasm of prostate: Secondary | ICD-10-CM | POA: Diagnosis not present

## 2018-01-24 DIAGNOSIS — Z13220 Encounter for screening for lipoid disorders: Secondary | ICD-10-CM | POA: Diagnosis not present

## 2018-01-24 DIAGNOSIS — Z0000 Encounter for general adult medical examination without abnormal findings: Secondary | ICD-10-CM | POA: Diagnosis not present

## 2018-01-24 DIAGNOSIS — Z131 Encounter for screening for diabetes mellitus: Secondary | ICD-10-CM | POA: Diagnosis not present

## 2018-01-31 ENCOUNTER — Encounter: Payer: Self-pay | Admitting: Sports Medicine

## 2018-01-31 ENCOUNTER — Ambulatory Visit (INDEPENDENT_AMBULATORY_CARE_PROVIDER_SITE_OTHER): Payer: BLUE CROSS/BLUE SHIELD | Admitting: Sports Medicine

## 2018-01-31 VITALS — BP 109/65 | Ht 67.0 in | Wt 145.0 lb

## 2018-01-31 DIAGNOSIS — M5136 Other intervertebral disc degeneration, lumbar region: Secondary | ICD-10-CM | POA: Diagnosis not present

## 2018-01-31 DIAGNOSIS — M5126 Other intervertebral disc displacement, lumbar region: Secondary | ICD-10-CM

## 2018-01-31 MED ORDER — PREDNISONE 20 MG PO TABS: ORAL_TABLET | ORAL | 0 refills | Status: DC

## 2018-01-31 NOTE — Progress Notes (Signed)
Chief complaint:  Acute low back pain, with symptoms getting worse  History of present illness: George Ramos is a 62 year old male who presents to sports medicine office today with chief complaint of acute low back pain.  Symptoms have been ongoing for about 3 weeks now.  He has been seen here twice previously in the office for acute low back pain over the last month.  In brief review, he reports that he started noticing pain at the beginning of the month while he was playing tennis.  He does not report of any inciting factor, trauma, or injury to explain the pain.  He does have a known history of previously ruptured disc at the L5-S1 level.  He reports pain is increased with back extension.  It was suspected that he had acute herniation of the lumbar spine, in the setting of degenerative lumbar disc disease.  He was given a short course of prednisone, as well as increasing his regular gabapentin dose.  At last office visit approximately 2 weeks ago, it was suggested that he continue with gabapentin 600 mg 3 times daily, use ibuprofen as needed for pain, as well as doing moderate exercise such as walking.  Unfortunately, he has had acute worsening of symptoms today.  He reports that he was playing some light tennis about 4 days ago, noticed the next day having increasing pain in his lower back. He describes the pain as a sharp-shooting pain. He does not report of any sudden movements or twists during that time that could have caused the pain.  He does not report of any having any pain during or immediately after the tennis activity.  He reports having associated symptoms of left foot numbness and tingling.  He describes the pain being more in a L5 nerve root distribution.  He also reports feeling numbness on the plantar aspect of his left foot.  He reports that his gait is a little bit altered, feels he can't shift as tight as the like.  He does not report of any bowel or bladder incontinence, does not report of any  saddle anesthesia.  He does not report of any fevers, chills, night sweats, or any unintentional weight loss.  He has been using the gabapentin 600 mg 3 times daily, also reports last night due to increased pain he took 5 over-the-counter ibuprofen.  He reports being uncomfortable at night and often times has to switch positions.  Review of systems:  As stated above  Interval past medical history, surgical history, family history, and social history obtained and unchanged.  Past medical condition is notable for BPH, cervical spondylosis, and incomplete emptying of bladder;  surgical history is notable for TURP and inguinal hernia repair; family history notable for cancer; he does not report of any current tobacco use; allergies and medications are reviewed and are reflected in EMR.  Physical exam: Vital signs are reviewed and are documented in the chart Gen.: Alert, oriented, appears stated age, in no apparent distress HEENT: Moist oral mucosa Respiratory: Normal respirations, able to speak in full sentences Cardiac: Regular rate, distal pulses 2+ Integumentary: No rashes on visible skin:  Neurologic: He does have slight weakness noted with left great toe extension, would categorize strength as 4/5, strength is intact with hip flexion, hip extension, hip abduction, knee flexion, knee extension, ankle dorsiflexion, and ankle plantar flexion otherwise on both sides, strength is 5/5, strength 5/5 with right great toe extension, sensation 2+ in B/L LE, DTR symmetric and intact and B/L LE,  no evidence of foot drop Psych: Normal affect, mood is described as good Musculoskeletal: Inspection of his lumbar spine reveals no obvious deformity or muscle atrophy, no warmth, erythema, ecchymosis, or effusion, he describes having pain more so in the left paralumbar spinal region, but I am not able to palpate any tender areas in the region where he describes feeling the pain, tenderness to palpation in the midline  the lumbar spine or the SI joints, no tenderness in the anterior hip, lateral hip, posterior hip, including the piriformis, gluteus medius, gluteus minimus, when laying down he is obviously having discomfort and is not able to fully extend his left leg secondary to pain, he tends to have his left leg more flexed, abducted, and externally rotated, no pain with hip IR or ER, straight leg testing is grossly positive on the left side with [employer] down and sitting up, he does walk with a slight widened antalgic gait on the left side compared to the right side, has difficulty with standing on toe and heel walks secondary to pain and weakness  Assessment and plan: 1. Acute low back pain, in setting of known history of lumbar degenerative disc disease, suspect acute herniation   Plan: Discussed with Yony today that most likely his tennis activities flared up symptoms. Discussed having delayed onset swelling around the disc due to it's poor circulation. Given neurologic symptoms, with straight leg being positive and motor weakness with decreased strength with great toe extension on left side, do feel that MRI is warranted for further evaluation for disc hernation that would require any surgical intervention, such as microdiscetomy. He did have an XR done at chiropractor office 2 weeks ago. Discussed to bring images with him by office at earliest convenience for [place] to review. Will have him repeat a short course of prednisone, 20 mg daily x 7 days. Encouraged continued short walks. Will call after MRI, with next step to be determined based on that.  Mort Sawyers, M.D. [facility]  I observed and examined the patient with the [facility] Fellow and agree with assessment and plan.  Note reviewed and modified by me. Stefanie Libel, MD

## 2018-02-01 ENCOUNTER — Other Ambulatory Visit: Payer: Self-pay | Admitting: *Deleted

## 2018-02-01 MED ORDER — HYDROCODONE-ACETAMINOPHEN 5-325 MG PO TABS: ORAL_TABLET | ORAL | 0 refills | Status: DC

## 2018-02-02 ENCOUNTER — Ambulatory Visit
Admission: RE | Admit: 2018-02-02 | Discharge: 2018-02-02 | Disposition: A | Discharge status: Home / Self Care | Payer: BLUE CROSS/BLUE SHIELD | Source: Ambulatory Visit | Attending: Sports Medicine | Admitting: Sports Medicine

## 2018-02-02 DIAGNOSIS — M5126 Other intervertebral disc displacement, lumbar region: Secondary | ICD-10-CM

## 2018-02-02 DIAGNOSIS — M48061 Spinal stenosis, lumbar region without neurogenic claudication: Secondary | ICD-10-CM | POA: Diagnosis not present

## 2018-02-02 DIAGNOSIS — M5136 Other intervertebral disc degeneration, lumbar region: Principal | ICD-10-CM

## 2018-02-07 ENCOUNTER — Ambulatory Visit: Payer: BLUE CROSS/BLUE SHIELD | Admitting: Sports Medicine

## 2018-02-07 ENCOUNTER — Encounter

## 2018-02-13 ENCOUNTER — Other Ambulatory Visit: Payer: Self-pay

## 2018-02-13 MED ORDER — MELOXICAM 15 MG PO TABS: 15.0000 mg | ORAL_TABLET | Freq: Every day | ORAL | 5 refills | Status: DC

## 2018-02-24 DIAGNOSIS — M5136 Other intervertebral disc degeneration, lumbar region: Secondary | ICD-10-CM | POA: Diagnosis not present

## 2018-02-24 DIAGNOSIS — M9983 Other biomechanical lesions of lumbar region: Secondary | ICD-10-CM | POA: Diagnosis not present

## 2018-02-24 DIAGNOSIS — M5416 Radiculopathy, lumbar region: Secondary | ICD-10-CM | POA: Diagnosis not present

## 2018-03-21 ENCOUNTER — Other Ambulatory Visit: Payer: Self-pay

## 2018-03-21 MED ORDER — GABAPENTIN 600 MG PO TABS: 600.0000 mg | ORAL_TABLET | Freq: Three times a day (TID) | ORAL | 1 refills | Status: DC

## 2018-04-25 ENCOUNTER — Other Ambulatory Visit: Payer: Self-pay

## 2018-04-25 MED ORDER — GABAPENTIN 600 MG PO TABS: 600.0000 mg | ORAL_TABLET | Freq: Every day | ORAL | 3 refills | Status: DC

## 2018-05-22 ENCOUNTER — Other Ambulatory Visit: Payer: Self-pay | Admitting: *Deleted

## 2018-05-22 MED ORDER — GABAPENTIN 600 MG PO TABS: 600.0000 mg | ORAL_TABLET | Freq: Every day | ORAL | 1 refills | Status: DC

## 2018-06-27 DIAGNOSIS — D225 Melanocytic nevi of trunk: Secondary | ICD-10-CM | POA: Diagnosis not present

## 2018-06-27 DIAGNOSIS — L821 Other seborrheic keratosis: Secondary | ICD-10-CM | POA: Diagnosis not present

## 2018-06-27 DIAGNOSIS — L570 Actinic keratosis: Secondary | ICD-10-CM | POA: Diagnosis not present

## 2018-06-27 DIAGNOSIS — D1801 Hemangioma of skin and subcutaneous tissue: Secondary | ICD-10-CM | POA: Diagnosis not present

## 2018-11-01 ENCOUNTER — Ambulatory Visit (INDEPENDENT_AMBULATORY_CARE_PROVIDER_SITE_OTHER): Payer: Self-pay

## 2018-11-01 ENCOUNTER — Ambulatory Visit (INDEPENDENT_AMBULATORY_CARE_PROVIDER_SITE_OTHER): Payer: BLUE CROSS/BLUE SHIELD | Admitting: Orthopaedic Surgery

## 2018-11-01 DIAGNOSIS — M79671 Pain in right foot: Secondary | ICD-10-CM

## 2018-11-01 NOTE — Progress Notes (Signed)
Office Visit Note   Patient: George Ramos           Date of Birth: 1956-07-15           MRN: [id number] Visit Date: 11/01/2018              Requested by: No referring provider defined for this encounter. PCP: Florina Ou, MD (Inactive)   Assessment & Plan: Visit Diagnoses:  1. Pain in right foot     Plan: Impression is right foot lateral column overloading.  I recommend being evaluated for neutral shoes so that he does not supinate excessively.  He does not present like he has a stress fracture.  I think is fine for him to continue to play tennis as much as he wants.  I would anticipate that this should get better on its own now that he has made the appropriate adjustments to his shoes.  Follow-Up Instructions: Return if symptoms worsen or fail to improve.   Orders:  Orders Placed This Encounter  Procedures  . XR Foot Complete Right   No orders of the defined types were placed in this encounter.     Procedures: No procedures performed   Clinical Data: No additional findings.   Subjective: Chief Complaint  Patient presents with  . Right Foot - Pain    George Ramos is a 63 year old gentleman who I play tennis with who comes in with a few months of lateral sided right foot pain in no one particular area that is worse in the morning when he first wakes up for an hour and then it gets better.  He was using an orthotic that helped to support his arch but he felt like this made his lateral foot pain worse.  He has since removed this orthotic and he feels like his foot pain is getting better.  He mainly wants to get it checked out to make sure he does not have any stress fractures or any issues with continuing to play tennis.  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   Review of Systems  Constitutional: Negative.   All other systems reviewed and are negative.    Objective: Vital Signs: There were no vitals taken for this visit.  Physical  Exam Vitals signs and nursing note reviewed.  Constitutional:      Appearance: He is well-developed.  HENT:     Head: Normocephalic and atraumatic.  Eyes:     Pupils: Pupils are equal, round, and reactive to light.  Neck:     Musculoskeletal: Neck supple.  Pulmonary:     Effort: Pulmonary effort is normal.  Abdominal:     Palpations: Abdomen is soft.  Musculoskeletal: Normal range of motion.  Skin:    General: Skin is warm.  Neurological:     Mental Status: He is alert and oriented to person, place, and time.  Psychiatric:        Behavior: Behavior normal.        Thought Content: Thought content normal.        Judgment: Judgment normal.     Ortho Exam Right foot exam shows no swelling or tendon subluxation.  He has no point tenderness along the metatarsals.  He has no reproducible pain today.  He states that most the time he has pain on the lateral aspect of the heel and along the lateral column. Specialty Comments:  No specialty comments available.  Imaging: Xr Foot Complete Right  Result Date:  11/01/2018 No acute or structural abnormalities.    PMFS History: Patient Active Problem List   Diagnosis Date Noted  . Bilateral anterior knee pain 11/03/2017  . Medial epicondylitis of elbow, left 01/06/2017  . Left elbow pain 07/28/2016  . Left wrist pain 05/20/2016  . Bleeding 09/20/2013  . Lateral epicondylitis of right elbow 12/19/2012  . Abnormal gait 07/13/2012  . Right foot pain 03/28/2012  . Cervical spine degeneration 10/27/2011  . Hamstring muscle strain 05/13/2011  . OTHER TEAR CARTILAGE OR MENISCUS KNEE CURRENT 03/19/2009  . SHOULDER PAIN, RIGHT, CHRONIC 10/29/2008  . Pain in joint, lower leg 10/29/2008  . SPRAIN/STRAIN, ANKLE, DISTAL TIBIOFIBULAR 01/20/2007  . Degeneration of lumbar intervertebral disc with acute herniation 12/16/2006  . BACK PAIN 12/16/2006   Past Medical History:  Diagnosis Date  . Arthritis   . BPH (benign prostatic hypertrophy)    . Cervical spondylosis   . Incomplete emptying of bladder   . Lumbar stenosis   . Nocturia     Family History  Problem Relation Age of Onset  . Cancer Father 29       colon ca  . Cancer Sister 20       colon ca    Past Surgical History:  Procedure Laterality Date  . colonoscopy    . COLONOSCOPY WITH PROPOFOL N/A 09/16/2015   Procedure: COLONOSCOPY WITH PROPOFOL;  Surgeon: Garlan Fair, MD;  Location: [facility];  Service: Endoscopy;  Laterality: N/A;  . GREEN LIGHT LASER TURP (TRANSURETHRAL RESECTION OF PROSTATE N/A 11/20/2013   Procedure: GREEN LIGHT LASER TURP (TRANSURETHRAL RESECTION OF PROSTATE;  Surgeon: Fredricka Bonine, MD;  Location: [facility];  Service: Urology;  Laterality: N/A;  . INGUINAL HERNIA REPAIR  AGE 68   Social History   Occupational History  . Not on file  Tobacco Use  . Smoking status: Never Smoker  . Smokeless tobacco: Never Used  Substance and Sexual Activity  . Alcohol use: No  . Drug use: No  . Sexual activity: Not on file

## 2018-11-24 ENCOUNTER — Other Ambulatory Visit: Payer: Self-pay | Admitting: Sports Medicine

## 2019-05-19 ENCOUNTER — Other Ambulatory Visit: Payer: Self-pay | Admitting: Sports Medicine

## 2019-07-14 DIAGNOSIS — R05 Cough: Secondary | ICD-10-CM | POA: Diagnosis not present

## 2019-07-14 DIAGNOSIS — Z20828 Contact with and (suspected) exposure to other viral communicable diseases: Secondary | ICD-10-CM | POA: Diagnosis not present

## 2019-07-14 DIAGNOSIS — B349 Viral infection, unspecified: Secondary | ICD-10-CM | POA: Diagnosis not present

## 2019-07-14 DIAGNOSIS — R5383 Other fatigue: Secondary | ICD-10-CM | POA: Diagnosis not present

## 2019-07-14 DIAGNOSIS — R509 Fever, unspecified: Secondary | ICD-10-CM | POA: Diagnosis not present

## 2019-07-23 ENCOUNTER — Other Ambulatory Visit: Payer: Self-pay

## 2019-07-23 ENCOUNTER — Encounter (HOSPITAL_COMMUNITY): Payer: Self-pay

## 2019-07-23 ENCOUNTER — Ambulatory Visit (HOSPITAL_COMMUNITY)
Admission: EM | Admit: 2019-07-23 | Discharge: 2019-07-23 | Disposition: A | Discharge status: Home / Self Care | Payer: BC Managed Care – PPO | Attending: Family Medicine | Admitting: Family Medicine

## 2019-07-23 DIAGNOSIS — R5383 Other fatigue: Secondary | ICD-10-CM | POA: Diagnosis not present

## 2019-07-23 LAB — POCT URINALYSIS DIP (DEVICE)
Bilirubin Urine: NEGATIVE
Glucose, UA: NEGATIVE mg/dL
Hgb urine dipstick: NEGATIVE
Ketones, ur: NEGATIVE mg/dL
Leukocytes,Ua: NEGATIVE
Nitrite: NEGATIVE
Protein, ur: NEGATIVE mg/dL
Specific Gravity, Urine: 1.02 (ref 1.005–1.030)
Urobilinogen, UA: 0.2 mg/dL (ref 0.0–1.0)
pH: 6 (ref 5.0–8.0)

## 2019-07-23 LAB — CBC WITH DIFFERENTIAL/PLATELET
Abs Immature Granulocytes: 0.02 10*3/uL (ref 0.00–0.07)
Basophils Absolute: 0 10*3/uL (ref 0.0–0.1)
Basophils Relative: 1 %
Eosinophils Absolute: 0.1 10*3/uL (ref 0.0–0.5)
Eosinophils Relative: 1 %
HCT: 48.4 % (ref 39.0–52.0)
Hemoglobin: 16.8 g/dL (ref 13.0–17.0)
Immature Granulocytes: 0 %
Lymphocytes Relative: 23 %
Lymphs Abs: 1.8 10*3/uL (ref 0.7–4.0)
MCH: 30.5 pg (ref 26.0–34.0)
MCHC: 34.7 g/dL (ref 30.0–36.0)
MCV: 88 fL (ref 80.0–100.0)
Monocytes Absolute: 0.6 10*3/uL (ref 0.1–1.0)
Monocytes Relative: 8 %
Neutro Abs: 5.2 10*3/uL (ref 1.7–7.7)
Neutrophils Relative %: 67 %
Platelets: 222 10*3/uL (ref 150–400)
RBC: 5.5 MIL/uL (ref 4.22–5.81)
RDW: 12.6 % (ref 11.5–15.5)
WBC: 7.8 10*3/uL (ref 4.0–10.5)
nRBC: 0 % (ref 0.0–0.2)

## 2019-07-23 LAB — COMPREHENSIVE METABOLIC PANEL
ALT: 20 U/L (ref 0–44)
AST: 19 U/L (ref 15–41)
Albumin: 4 g/dL (ref 3.5–5.0)
Alkaline Phosphatase: 62 U/L (ref 38–126)
Anion gap: 9 (ref 5–15)
BUN: 11 mg/dL (ref 8–23)
CO2: 26 mmol/L (ref 22–32)
Calcium: 9.1 mg/dL (ref 8.9–10.3)
Chloride: 103 mmol/L (ref 98–111)
Creatinine, Ser: 0.79 mg/dL (ref 0.61–1.24)
GFR calc Af Amer: >60 mL/min (ref >60)
GFR calc non Af Amer: >60 mL/min (ref >60)
Glucose, Bld: 103 mg/dL — ABNORMAL HIGH (ref 70–99)
Potassium: 4.3 mmol/L (ref 3.5–5.1)
Sodium: 138 mmol/L (ref 135–145)
Total Bilirubin: 1.3 mg/dL — ABNORMAL HIGH (ref 0.3–1.2)
Total Protein: 6.7 g/dL (ref 6.5–8.1)

## 2019-07-23 LAB — TSH: TSH: 1.347 u[IU]/mL (ref 0.350–4.500)

## 2019-07-23 NOTE — ED Provider Notes (Signed)
[place]    CSN: [id number] Arrival date & time: 07/23/19  1024      History   Chief Complaint No chief complaint on file.   HPI George Ramos is a 63 y.o. male.   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  He is complaining about feeling rundown and fatigued for the last month or so.  His son was diagnosed with mononucleosis during this time.  Patient had no fever, sore throat, rash.  Because of his work, he is often exposed to ticks during the summer.  He recently underwent a test for COVID-19 and was negative.     Past Medical History:  Diagnosis Date  . Arthritis   . BPH (benign prostatic hypertrophy)   . Cervical spondylosis   . Incomplete emptying of bladder   . Lumbar stenosis   . Nocturia     Patient Active Problem List   Diagnosis Date Noted  . Bilateral anterior knee pain 11/03/2017  . Medial epicondylitis of elbow, left 01/06/2017  . Left elbow pain 07/28/2016  . Left wrist pain 05/20/2016  . Bleeding 09/20/2013  . Lateral epicondylitis of right elbow 12/19/2012  . Abnormal gait 07/13/2012  . Right foot pain 03/28/2012  . Cervical spine degeneration 10/27/2011  . Hamstring muscle strain 05/13/2011  . OTHER TEAR CARTILAGE OR MENISCUS KNEE CURRENT 03/19/2009  . SHOULDER PAIN, RIGHT, CHRONIC 10/29/2008  . Pain in joint, lower leg 10/29/2008  . SPRAIN/STRAIN, ANKLE, DISTAL TIBIOFIBULAR 01/20/2007  . Degeneration of lumbar intervertebral disc with acute herniation 12/16/2006  . BACK PAIN 12/16/2006    Past Surgical History:  Procedure Laterality Date  . colonoscopy    . COLONOSCOPY WITH PROPOFOL N/A 09/16/2015   Procedure: COLONOSCOPY WITH PROPOFOL;  Surgeon: Garlan Fair, MD;  Location: [facility];  Service: Endoscopy;  Laterality: N/A;  . GREEN LIGHT LASER TURP (TRANSURETHRAL RESECTION OF PROSTATE N/A 11/20/2013   Procedure: GREEN LIGHT LASER TURP (TRANSURETHRAL RESECTION OF PROSTATE;   Surgeon: Fredricka Bonine, MD;  Location: [facility];  Service: Urology;  Laterality: N/A;  . INGUINAL HERNIA REPAIR  AGE 76       Home Medications    Prior to Admission medications   Medication Sig Start Date End Date Taking? Authorizing Provider  gabapentin (NEURONTIN) 600 MG tablet TAKE 1 TABLET BY MOUTH AT BEDTIME 05/22/19   Stefanie Libel, MD  Multiple Vitamins-Minerals (MENS 50+ MULTI VITAMIN/MIN PO) Take 1 tablet by mouth daily.    [provider]    Family History Family History  Problem Relation Age of Onset  . Cancer Father 17       colon ca  . Cancer Sister 38       colon ca    Social History Social History   Tobacco Use  . Smoking status: Never Smoker  . Smokeless tobacco: Never Used  Substance Use Topics  . Alcohol use: No  . Drug use: No     Allergies   Patient has no known allergies.   Review of Systems Review of Systems  Constitutional: Positive for fatigue.  All other systems reviewed and are negative.    Physical Exam Triage Vital Signs ED Triage Vitals  Enc Vitals Group     BP      Pulse      Resp      Temp      Temp src      SpO2  Weight      Height      Head Circumference      Peak Flow      Pain Score      Pain Loc      Pain Edu?      Excl. in [place]?    No data found.  Updated Vital Signs BP 128/78 (BP Location: Left Arm)   Pulse 60   Temp 98.1 F (36.7 C) (Temporal)   Resp 16   SpO2 99%    Physical Exam Vitals signs and nursing note reviewed.  Constitutional:      Appearance: Normal appearance. He is normal weight.  HENT:     Head: Normocephalic.     Mouth/Throat:     Mouth: Mucous membranes are moist.     Pharynx: Oropharynx is clear.  Eyes:     Conjunctiva/sclera: Conjunctivae normal.  Neck:     Musculoskeletal: Normal range of motion and neck supple.  Cardiovascular:     Rate and Rhythm: Normal rate and regular rhythm.     Heart sounds: Normal heart sounds.  Pulmonary:      Effort: Pulmonary effort is normal.     Breath sounds: Normal breath sounds.  Abdominal:     General: Abdomen is flat.     Palpations: There is no mass.     Tenderness: There is no abdominal tenderness. There is no guarding or rebound.  Musculoskeletal: Normal range of motion.  Skin:    General: Skin is warm and dry.  Neurological:     General: No focal deficit present.     Mental Status: He is alert and oriented to person, place, and time.  Psychiatric:        Mood and Affect: Mood normal.        Behavior: Behavior normal.        Thought Content: Thought content normal.        Judgment: Judgment normal.      UC Treatments / Results  Labs (all labs ordered are listed, but only abnormal results are displayed) Labs Reviewed  CBC WITH DIFFERENTIAL/PLATELET  COMPREHENSIVE METABOLIC PANEL  EPSTEIN-BARR VIRUS VCA, IGM  B. BURGDORFI ANTIBODIES  ROCKY MTN SPOTTED FVR ABS PNL(IGG+IGM)  TSH    EKG   Radiology No results found.  Procedures Procedures (including critical care time)  Medications Ordered in UC Medications - No data to display  Initial Impression / Assessment and Plan / UC Course  I have reviewed the triage vital signs and the nursing notes.  Pertinent labs & imaging results that were available during my care of the patient were reviewed by me and considered in my medical decision making (see chart for details).     Final Clinical Impressions(s) / UC Diagnoses   Final diagnoses:  Fatigue, unspecified type     Discharge Instructions     We are running a set of blood tests that should be back over the next several days.  You can go online and check for results at my chart.  Let you know if I see anything that explains the fatigue.  Sometimes common viruses can cause this syndrome.  If you develop new symptoms, do not hesitate calling me.    ED Prescriptions    None     I have reviewed the PDMP during this encounter.   Robyn Haber,  MD 07/23/19 1121

## 2019-07-23 NOTE — Discharge Instructions (Addendum)
We are running a set of blood tests that should be back over the next several days.  You can go online and check for results at my chart.  Let you know if I see anything that explains the fatigue.  Sometimes common viruses can cause this syndrome.  If you develop new symptoms, do not hesitate calling me.

## 2019-07-23 NOTE — ED Triage Notes (Signed)
Pt presents to UC stating he has been feeling "run down" for over 3 weeks. Pt has had negative flu and covid tests.

## 2019-07-24 LAB — ROCKY MTN SPOTTED FVR ABS PNL(IGG+IGM)
RMSF IgG: NEGATIVE
RMSF IgM: 0.31 index (ref 0.00–0.89)

## 2019-07-24 LAB — EPSTEIN-BARR VIRUS VCA, IGM: EBV VCA IgM: <36 U/mL (ref 0.0–35.9)

## 2019-07-24 LAB — B. BURGDORFI ANTIBODIES: B burgdorferi Ab IgG+IgM: <0.91 {ISR} (ref 0.00–0.90)

## 2019-07-26 ENCOUNTER — Telehealth (HOSPITAL_COMMUNITY): Payer: Self-pay | Admitting: Emergency Medicine

## 2019-07-26 NOTE — Telephone Encounter (Signed)
Patient contacted and made aware of   lab results, all questions answered Dr. Joseph Art had previously notified the patient.

## 2019-10-12 DIAGNOSIS — Z20822 Contact with and (suspected) exposure to covid-19: Secondary | ICD-10-CM | POA: Diagnosis not present

## 2019-11-16 ENCOUNTER — Other Ambulatory Visit: Payer: Self-pay | Admitting: Sports Medicine

## 2020-02-01 DIAGNOSIS — M9903 Segmental and somatic dysfunction of lumbar region: Secondary | ICD-10-CM | POA: Diagnosis not present

## 2020-02-01 DIAGNOSIS — M9905 Segmental and somatic dysfunction of pelvic region: Secondary | ICD-10-CM | POA: Diagnosis not present

## 2020-02-01 DIAGNOSIS — M4606 Spinal enthesopathy, lumbar region: Secondary | ICD-10-CM | POA: Diagnosis not present

## 2020-02-01 DIAGNOSIS — M5442 Lumbago with sciatica, left side: Secondary | ICD-10-CM | POA: Diagnosis not present

## 2020-02-05 DIAGNOSIS — M4606 Spinal enthesopathy, lumbar region: Secondary | ICD-10-CM | POA: Diagnosis not present

## 2020-02-05 DIAGNOSIS — M5442 Lumbago with sciatica, left side: Secondary | ICD-10-CM | POA: Diagnosis not present

## 2020-02-05 DIAGNOSIS — M9903 Segmental and somatic dysfunction of lumbar region: Secondary | ICD-10-CM | POA: Diagnosis not present

## 2020-02-05 DIAGNOSIS — M9905 Segmental and somatic dysfunction of pelvic region: Secondary | ICD-10-CM | POA: Diagnosis not present

## 2020-02-11 DIAGNOSIS — M9903 Segmental and somatic dysfunction of lumbar region: Secondary | ICD-10-CM | POA: Diagnosis not present

## 2020-02-11 DIAGNOSIS — M4606 Spinal enthesopathy, lumbar region: Secondary | ICD-10-CM | POA: Diagnosis not present

## 2020-02-11 DIAGNOSIS — M5442 Lumbago with sciatica, left side: Secondary | ICD-10-CM | POA: Diagnosis not present

## 2020-02-11 DIAGNOSIS — M9905 Segmental and somatic dysfunction of pelvic region: Secondary | ICD-10-CM | POA: Diagnosis not present

## 2020-02-14 DIAGNOSIS — M4606 Spinal enthesopathy, lumbar region: Secondary | ICD-10-CM | POA: Diagnosis not present

## 2020-02-14 DIAGNOSIS — M5442 Lumbago with sciatica, left side: Secondary | ICD-10-CM | POA: Diagnosis not present

## 2020-02-14 DIAGNOSIS — M9903 Segmental and somatic dysfunction of lumbar region: Secondary | ICD-10-CM | POA: Diagnosis not present

## 2020-02-14 DIAGNOSIS — M9905 Segmental and somatic dysfunction of pelvic region: Secondary | ICD-10-CM | POA: Diagnosis not present

## 2020-02-21 DIAGNOSIS — M9903 Segmental and somatic dysfunction of lumbar region: Secondary | ICD-10-CM | POA: Diagnosis not present

## 2020-02-21 DIAGNOSIS — M9905 Segmental and somatic dysfunction of pelvic region: Secondary | ICD-10-CM | POA: Diagnosis not present

## 2020-02-21 DIAGNOSIS — M4606 Spinal enthesopathy, lumbar region: Secondary | ICD-10-CM | POA: Diagnosis not present

## 2020-02-21 DIAGNOSIS — M5442 Lumbago with sciatica, left side: Secondary | ICD-10-CM | POA: Diagnosis not present

## 2020-02-26 DIAGNOSIS — D225 Melanocytic nevi of trunk: Secondary | ICD-10-CM | POA: Diagnosis not present

## 2020-02-26 DIAGNOSIS — C44329 Squamous cell carcinoma of skin of other parts of face: Secondary | ICD-10-CM | POA: Diagnosis not present

## 2020-02-26 DIAGNOSIS — D485 Neoplasm of uncertain behavior of skin: Secondary | ICD-10-CM | POA: Diagnosis not present

## 2020-02-26 DIAGNOSIS — L570 Actinic keratosis: Secondary | ICD-10-CM | POA: Diagnosis not present

## 2020-02-26 DIAGNOSIS — D1801 Hemangioma of skin and subcutaneous tissue: Secondary | ICD-10-CM | POA: Diagnosis not present

## 2020-02-26 DIAGNOSIS — L821 Other seborrheic keratosis: Secondary | ICD-10-CM | POA: Diagnosis not present

## 2020-02-28 DIAGNOSIS — M5442 Lumbago with sciatica, left side: Secondary | ICD-10-CM | POA: Diagnosis not present

## 2020-02-28 DIAGNOSIS — M9905 Segmental and somatic dysfunction of pelvic region: Secondary | ICD-10-CM | POA: Diagnosis not present

## 2020-02-28 DIAGNOSIS — M9903 Segmental and somatic dysfunction of lumbar region: Secondary | ICD-10-CM | POA: Diagnosis not present

## 2020-02-28 DIAGNOSIS — M4606 Spinal enthesopathy, lumbar region: Secondary | ICD-10-CM | POA: Diagnosis not present

## 2020-03-04 DIAGNOSIS — M4606 Spinal enthesopathy, lumbar region: Secondary | ICD-10-CM | POA: Diagnosis not present

## 2020-03-04 DIAGNOSIS — M5442 Lumbago with sciatica, left side: Secondary | ICD-10-CM | POA: Diagnosis not present

## 2020-03-04 DIAGNOSIS — M9903 Segmental and somatic dysfunction of lumbar region: Secondary | ICD-10-CM | POA: Diagnosis not present

## 2020-03-04 DIAGNOSIS — M9905 Segmental and somatic dysfunction of pelvic region: Secondary | ICD-10-CM | POA: Diagnosis not present

## 2020-03-13 DIAGNOSIS — D0439 Carcinoma in situ of skin of other parts of face: Secondary | ICD-10-CM | POA: Diagnosis not present

## 2020-03-17 DIAGNOSIS — H2513 Age-related nuclear cataract, bilateral: Secondary | ICD-10-CM | POA: Diagnosis not present

## 2020-03-26 DIAGNOSIS — L249 Irritant contact dermatitis, unspecified cause: Secondary | ICD-10-CM | POA: Diagnosis not present

## 2020-03-31 DIAGNOSIS — R7309 Other abnormal glucose: Secondary | ICD-10-CM | POA: Diagnosis not present

## 2020-03-31 DIAGNOSIS — Z125 Encounter for screening for malignant neoplasm of prostate: Secondary | ICD-10-CM | POA: Diagnosis not present

## 2020-03-31 DIAGNOSIS — Z0000 Encounter for general adult medical examination without abnormal findings: Secondary | ICD-10-CM | POA: Diagnosis not present

## 2020-03-31 DIAGNOSIS — Z13220 Encounter for screening for lipoid disorders: Secondary | ICD-10-CM | POA: Diagnosis not present

## 2020-04-08 ENCOUNTER — Telehealth (INDEPENDENT_AMBULATORY_CARE_PROVIDER_SITE_OTHER): Payer: BC Managed Care – PPO | Admitting: Sports Medicine

## 2020-04-08 DIAGNOSIS — H2513 Age-related nuclear cataract, bilateral: Secondary | ICD-10-CM | POA: Diagnosis not present

## 2020-04-08 DIAGNOSIS — M5126 Other intervertebral disc displacement, lumbar region: Secondary | ICD-10-CM | POA: Diagnosis not present

## 2020-04-08 DIAGNOSIS — M5136 Other intervertebral disc degeneration, lumbar region: Secondary | ICD-10-CM | POA: Diagnosis not present

## 2020-04-08 DIAGNOSIS — H25043 Posterior subcapsular polar age-related cataract, bilateral: Secondary | ICD-10-CM | POA: Diagnosis not present

## 2020-04-08 DIAGNOSIS — H25013 Cortical age-related cataract, bilateral: Secondary | ICD-10-CM | POA: Diagnosis not present

## 2020-04-08 DIAGNOSIS — H18413 Arcus senilis, bilateral: Secondary | ICD-10-CM | POA: Diagnosis not present

## 2020-04-08 MED ORDER — GABAPENTIN 600 MG PO TABS: 600.0000 mg | ORAL_TABLET | Freq: Every day | ORAL | 3 refills | Status: DC

## 2020-04-08 NOTE — Progress Notes (Addendum)
Patient agrees to a video visit as he is primarily concerned with his medication treatment Patient calls from home.  I connected from office in [place].  Chief complaint chronic low back pain  Patient has been followed by me for chronic lumbar disc disease since 2016 He is an avid tennis player and plays tournaments throughout the [place] and MRI have shown significant degenerative disc issues He has control this well with the use of gabapentin Generally he is able to get back on 600 of gabapentin at night We have use larger doses for flares He has had no side effects  He has come off the gabapentin on several occasions Each time he has had more difficulty sleeping The back pain would return him a few days  He currently has not been consistently doing his home exercise program  Past history Recent squamous cell skin cancer taken off his left cheek Degenerative disc disease in his cervical spine which is not currently symptomatic Multiple musculoskeletal injuries as documented  Review of systems No weakness No numbness Denies any bowel or bladder issues Good exercise tolerance and no chest pain or shortness of breath while exercising hard  Video exam Estimated vital signs seem normal Visual inspection shows he is in no acute distress conversant and alert He has no pain with sitting standing or motion of his back  Addendum Patient at home for video visit Physician in [facility] office.  Ila Mcgill, MD 03/10/21

## 2020-04-08 NOTE — Assessment & Plan Note (Signed)
We will continue his gabapentin 600 at night The only other medicine that he regularly takes is ibuprofen when he has some pain That is probably a couple times a week  He is quite stable with his current status regarding his lumbar disc disease however I did review and suggested that he go ahead and do prevent him flexion exercises for his low back  He also has a mild muscular asymmetry in his upper extremities from tennis I suggested some exercises for the nondominant side  We can renew this medicine for 1 year

## 2020-04-29 ENCOUNTER — Other Ambulatory Visit: Payer: Self-pay

## 2020-04-29 ENCOUNTER — Ambulatory Visit: Payer: BC Managed Care – PPO | Admitting: Sports Medicine

## 2020-04-29 DIAGNOSIS — M5136 Other intervertebral disc degeneration, lumbar region: Secondary | ICD-10-CM | POA: Diagnosis not present

## 2020-04-29 MED ORDER — PREDNISONE 20 MG PO TABS: 20.0000 mg | ORAL_TABLET | Freq: Two times a day (BID) | ORAL | 0 refills | Status: DC

## 2020-04-29 MED ORDER — GABAPENTIN 600 MG PO TABS: 600.0000 mg | ORAL_TABLET | Freq: Three times a day (TID) | ORAL | 3 refills | Status: DC

## 2020-04-29 NOTE — Patient Instructions (Addendum)
-  Take the medicine as directed -Start the exercises we showed you today -Walk for 10 minutes 3 times a day -Do easy motion for your low back -Follow up with [place] in 4 weeks -Call us if anything changes before your next appt

## 2020-04-29 NOTE — Assessment & Plan Note (Signed)
He has experienced a flare of his lumbar disc disease He has at least 3 levels of lower lumbar disc degeneration He has more foraminal stenosis on the left I do not think he has a herniated disc today although he has some positive findings  Plan Prednisone 20 twice daily x7 days Resume the gabapentin at 600 3 times daily Begin easing motion exercises for his low back Williams flexion exercises Short walks for about 10 minutes 3 times a day  I should recheck him in 3 to 4 weeks

## 2020-04-29 NOTE — Progress Notes (Signed)
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 About 3 days ago after hitting he had the onset of tightness and then increasing pain in his left low back This made it difficult for him to do work activities and he had to stop tennis  His last bad flare was April 2019 At that time MRI revealed foraminal stenosis impinging on nerve roots to the left lower extremity Degenerative change in the lower lumbar vertebrae I probable herniated disc at the right L5-S1  Since then he has made good control with gabapentin and has been able to reduce to 600 mg at night During the acute flare in April 2019 he had a 75% reduction of pain with a week of prednisone  He was seen by neurosurgery and considered for outpatient injection or surgery but he improved dramatically and chose not to do this  Review of systems No bowel or bladder issues No side effects from medication No weakness in the lower extremity No numbness Pain radiates down the back of the leg into the thigh and into the calf  Physical exam athletic white male who looks in some discomfort and sits leaning to the right BP 110/62   Ht 5\' 7"  (1.702 m)   Wt 140 lb (63.5 kg)   BMI 21.93 kg/m   Range of motion of the back reveals some pain in his low back with any extension Flexion at 35 degrees causes pain Right lateral bend causes pain on the left side Left lateral bend is nonpainful  Strength testing of the lower extremity showed good strength on abduction and abduction of the foot flexion and extension of the ankle and of the great toe He is able to walk heel toe and tandem without difficulty Straight leg raise was positive on the left Reflexes were preserved at knee and ankle bilaterally

## 2020-05-13 DIAGNOSIS — Z85828 Personal history of other malignant neoplasm of skin: Secondary | ICD-10-CM | POA: Diagnosis not present

## 2020-05-13 DIAGNOSIS — L821 Other seborrheic keratosis: Secondary | ICD-10-CM | POA: Diagnosis not present

## 2020-05-27 ENCOUNTER — Other Ambulatory Visit: Payer: Self-pay

## 2020-05-27 ENCOUNTER — Ambulatory Visit (INDEPENDENT_AMBULATORY_CARE_PROVIDER_SITE_OTHER): Payer: BC Managed Care – PPO | Admitting: Sports Medicine

## 2020-05-27 VITALS — BP 104/62 | Ht 67.0 in | Wt 140.0 lb

## 2020-05-27 DIAGNOSIS — M5136 Other intervertebral disc degeneration, lumbar region: Secondary | ICD-10-CM | POA: Diagnosis not present

## 2020-05-27 MED ORDER — GABAPENTIN 600 MG PO TABS: 600.0000 mg | ORAL_TABLET | Freq: Two times a day (BID) | ORAL | Status: DC

## 2020-05-27 NOTE — Patient Instructions (Signed)
It was great to see you today! Thank you for letting me participate in your care!  Today, we discussed your low back pain from degenerative disc disease and foraminal stenosis. I am glad you are doing better! Please continue taking Gabapentin 600mg  twice a day for the next 6 weeks and then you can decrease to 600mg  once per day at night time.  Please do the exercises we gave you 3 times per week. If you are better and feel good you do not need to have a follow up appoinment but if you are not feeling better or it gets worse please come see [place].  Be well, Harolyn Rutherford, DO PGY-4, Sports Medicine Fellow [place]

## 2020-05-27 NOTE — Progress Notes (Signed)
    SUBJECTIVE:   CHIEF COMPLAINT / HPI:   Low Back Pain [name] is a 64 year old male who is admitted patient to this practice who presents today for follow-up of his low back pain.  He has a known history of degenerative disc disease in the lumbar spine and foraminal stenosis of the lumbar spine.  He was seen about 4 weeks ago where he received a course of prednisone and we increased his gabapentin from 600 once a day to 600 3 times a day.  He states he is feeling much better and states that he is about "90% better".  For the past 4-5 days he has decreased his gabapentin to 600 mg twice per day and states that he is doing well with this.  He continues to do his back exercises 3 times a week with no complications or difficulties.  Overall, he is well pleased with his progress.  PERTINENT  PMH / PSH: Cervical spine degeneration  OBJECTIVE:   BP 104/62   Ht 5\' 7"  (1.702 m)   Wt 140 lb (63.5 kg)   BMI 21.93 kg/m   Lumbar spine: - Inspection: no gross deformity or asymmetry, swelling or ecchymosis. No skin changes. No gait abnormality. - Palpation: No TTP over the spinous processes, paraspinal muscles, or SI joints b/l - ROM: limited active ROM of the lumbar spine in rotation with 25 degrees on the left and 30 on the right, flexion to 80 degrees, extension to 30 degrees , decreased left side bending compared to the right; flexion and extension, rotation, and side bending without pain - Strength: 5/5 strength of lower extremity in L4-S1 nerve root distributions b/l. Successful toe and heel walks. - Neuro: sensation intact in the L4-S1 nerve root distribution b/l, 1+ L4 and S1 reflexes Special Tests:   - Straight Leg Raise test: Positive  - Slump test: Positive  ASSESSMENT/PLAN:   Degenerative disc disease, lumbar Patient is responding well to the treatment for his acute on chronic flare for his lumbar disc disease.  He has finished his prednisone and decrease his gabapentin  to 600 mg 2 times per day.  He will continue with gabapentin 600 mg 2 times per day for the next 6 weeks and then follow-up with [place] to taper.  Did discuss that if he is feeling much better he does not need follow-up with [place] in clinic and can just send a message about how to taper his gabapentin.  Continue with the Williams flexion exercises and continue to go on short walks.     Nuala Alpha, DO PGY-4, Sports Medicine Fellow [place]  I observed and examined the patient with the George Ramos Fellow and agree with assessment and plan.  Note reviewed and modified by me. Ila Mcgill, MD

## 2020-05-27 NOTE — Assessment & Plan Note (Signed)
Patient is responding well to the treatment for his acute on chronic flare for his lumbar disc disease.  He has finished his prednisone and decrease his gabapentin to 600 mg 2 times per day.  He will continue with gabapentin 600 mg 2 times per day for the next 6 weeks and then follow-up with [place] to taper.  Did discuss that if he is feeling much better he does not need follow-up with [place] in clinic and can just send a message about how to taper his gabapentin.  Continue with the Williams flexion exercises and continue to go on short walks.

## 2020-07-01 DIAGNOSIS — Z23 Encounter for immunization: Secondary | ICD-10-CM | POA: Diagnosis not present

## 2020-07-01 DIAGNOSIS — R972 Elevated prostate specific antigen [PSA]: Secondary | ICD-10-CM | POA: Diagnosis not present

## 2020-09-02 DIAGNOSIS — Z23 Encounter for immunization: Secondary | ICD-10-CM | POA: Diagnosis not present

## 2020-09-19 DIAGNOSIS — Z1159 Encounter for screening for other viral diseases: Secondary | ICD-10-CM | POA: Diagnosis not present

## 2020-09-24 DIAGNOSIS — K635 Polyp of colon: Secondary | ICD-10-CM | POA: Diagnosis not present

## 2020-09-24 DIAGNOSIS — D120 Benign neoplasm of cecum: Secondary | ICD-10-CM | POA: Diagnosis not present

## 2020-09-24 DIAGNOSIS — D122 Benign neoplasm of ascending colon: Secondary | ICD-10-CM | POA: Diagnosis not present

## 2020-09-24 DIAGNOSIS — Z800 Family history of malignant neoplasm of digestive organs: Secondary | ICD-10-CM | POA: Diagnosis not present

## 2020-09-24 DIAGNOSIS — Z86010 Personal history of colonic polyps: Secondary | ICD-10-CM | POA: Diagnosis not present

## 2020-10-20 DIAGNOSIS — L905 Scar conditions and fibrosis of skin: Secondary | ICD-10-CM | POA: Diagnosis not present

## 2020-10-20 DIAGNOSIS — L821 Other seborrheic keratosis: Secondary | ICD-10-CM | POA: Diagnosis not present

## 2020-10-20 DIAGNOSIS — Z85828 Personal history of other malignant neoplasm of skin: Secondary | ICD-10-CM | POA: Diagnosis not present

## 2020-10-20 DIAGNOSIS — D229 Melanocytic nevi, unspecified: Secondary | ICD-10-CM | POA: Diagnosis not present

## 2020-10-20 DIAGNOSIS — L570 Actinic keratosis: Secondary | ICD-10-CM | POA: Diagnosis not present

## 2020-10-20 DIAGNOSIS — B078 Other viral warts: Secondary | ICD-10-CM | POA: Diagnosis not present

## 2020-12-09 DIAGNOSIS — Z125 Encounter for screening for malignant neoplasm of prostate: Secondary | ICD-10-CM | POA: Diagnosis not present

## 2020-12-19 DIAGNOSIS — R972 Elevated prostate specific antigen [PSA]: Secondary | ICD-10-CM | POA: Diagnosis not present

## 2020-12-24 DIAGNOSIS — R972 Elevated prostate specific antigen [PSA]: Secondary | ICD-10-CM | POA: Diagnosis not present

## 2021-01-22 DIAGNOSIS — N401 Enlarged prostate with lower urinary tract symptoms: Secondary | ICD-10-CM | POA: Diagnosis not present

## 2021-01-22 DIAGNOSIS — R350 Frequency of micturition: Secondary | ICD-10-CM | POA: Diagnosis not present

## 2021-01-22 DIAGNOSIS — R972 Elevated prostate specific antigen [PSA]: Secondary | ICD-10-CM | POA: Diagnosis not present

## 2021-01-22 DIAGNOSIS — N5201 Erectile dysfunction due to arterial insufficiency: Secondary | ICD-10-CM | POA: Diagnosis not present

## 2021-01-23 ENCOUNTER — Other Ambulatory Visit: Payer: Self-pay | Admitting: Urology

## 2021-01-23 DIAGNOSIS — R972 Elevated prostate specific antigen [PSA]: Secondary | ICD-10-CM

## 2021-02-15 ENCOUNTER — Other Ambulatory Visit: Payer: Self-pay

## 2021-02-15 ENCOUNTER — Ambulatory Visit
Admission: RE | Admit: 2021-02-15 | Discharge: 2021-02-15 | Disposition: A | Discharge status: Home / Self Care | Payer: BC Managed Care – PPO | Source: Ambulatory Visit | Attending: Urology | Admitting: Urology

## 2021-02-15 DIAGNOSIS — N400 Enlarged prostate without lower urinary tract symptoms: Secondary | ICD-10-CM | POA: Diagnosis not present

## 2021-02-15 DIAGNOSIS — R972 Elevated prostate specific antigen [PSA]: Secondary | ICD-10-CM

## 2021-02-15 DIAGNOSIS — N410 Acute prostatitis: Secondary | ICD-10-CM | POA: Diagnosis not present

## 2021-02-15 DIAGNOSIS — N402 Nodular prostate without lower urinary tract symptoms: Secondary | ICD-10-CM | POA: Diagnosis not present

## 2021-02-15 MED ORDER — GADOBENATE DIMEGLUMINE 529 MG/ML IV SOLN
13.0000 mL | Freq: Once | INTRAVENOUS | Status: AC | PRN
  Administered 2021-02-15: 13 mL via INTRAVENOUS

## 2021-03-24 DIAGNOSIS — R972 Elevated prostate specific antigen [PSA]: Secondary | ICD-10-CM | POA: Diagnosis not present

## 2021-03-24 DIAGNOSIS — C61 Malignant neoplasm of prostate: Secondary | ICD-10-CM | POA: Diagnosis not present

## 2021-03-31 DIAGNOSIS — C61 Malignant neoplasm of prostate: Secondary | ICD-10-CM | POA: Diagnosis not present

## 2021-03-31 DIAGNOSIS — R350 Frequency of micturition: Secondary | ICD-10-CM | POA: Diagnosis not present

## 2021-03-31 DIAGNOSIS — N401 Enlarged prostate with lower urinary tract symptoms: Secondary | ICD-10-CM | POA: Diagnosis not present

## 2021-04-06 DIAGNOSIS — D229 Melanocytic nevi, unspecified: Secondary | ICD-10-CM | POA: Diagnosis not present

## 2021-04-06 DIAGNOSIS — L905 Scar conditions and fibrosis of skin: Secondary | ICD-10-CM | POA: Diagnosis not present

## 2021-04-06 DIAGNOSIS — Z85828 Personal history of other malignant neoplasm of skin: Secondary | ICD-10-CM | POA: Diagnosis not present

## 2021-05-06 DIAGNOSIS — R350 Frequency of micturition: Secondary | ICD-10-CM | POA: Diagnosis not present

## 2021-05-06 DIAGNOSIS — C61 Malignant neoplasm of prostate: Secondary | ICD-10-CM | POA: Diagnosis not present

## 2021-05-06 DIAGNOSIS — N401 Enlarged prostate with lower urinary tract symptoms: Secondary | ICD-10-CM | POA: Diagnosis not present

## 2021-05-08 DIAGNOSIS — Z13220 Encounter for screening for lipoid disorders: Secondary | ICD-10-CM | POA: Diagnosis not present

## 2021-05-08 DIAGNOSIS — R001 Bradycardia, unspecified: Secondary | ICD-10-CM | POA: Diagnosis not present

## 2021-05-08 DIAGNOSIS — R7309 Other abnormal glucose: Secondary | ICD-10-CM | POA: Diagnosis not present

## 2021-05-08 DIAGNOSIS — Z0000 Encounter for general adult medical examination without abnormal findings: Secondary | ICD-10-CM | POA: Diagnosis not present

## 2021-05-13 DIAGNOSIS — R001 Bradycardia, unspecified: Secondary | ICD-10-CM | POA: Diagnosis not present

## 2021-06-26 DIAGNOSIS — Z20822 Contact with and (suspected) exposure to covid-19: Secondary | ICD-10-CM | POA: Diagnosis not present

## 2021-07-28 DIAGNOSIS — C61 Malignant neoplasm of prostate: Secondary | ICD-10-CM | POA: Diagnosis not present

## 2021-09-14 DIAGNOSIS — M5442 Lumbago with sciatica, left side: Secondary | ICD-10-CM | POA: Diagnosis not present

## 2021-09-14 DIAGNOSIS — M7661 Achilles tendinitis, right leg: Secondary | ICD-10-CM | POA: Diagnosis not present

## 2021-09-14 DIAGNOSIS — M9905 Segmental and somatic dysfunction of pelvic region: Secondary | ICD-10-CM | POA: Diagnosis not present

## 2021-09-14 DIAGNOSIS — M9903 Segmental and somatic dysfunction of lumbar region: Secondary | ICD-10-CM | POA: Diagnosis not present

## 2021-09-14 DIAGNOSIS — M4606 Spinal enthesopathy, lumbar region: Secondary | ICD-10-CM | POA: Diagnosis not present

## 2021-09-17 DIAGNOSIS — M9903 Segmental and somatic dysfunction of lumbar region: Secondary | ICD-10-CM | POA: Diagnosis not present

## 2021-09-17 DIAGNOSIS — M4606 Spinal enthesopathy, lumbar region: Secondary | ICD-10-CM | POA: Diagnosis not present

## 2021-09-17 DIAGNOSIS — M7661 Achilles tendinitis, right leg: Secondary | ICD-10-CM | POA: Diagnosis not present

## 2021-09-17 DIAGNOSIS — M9905 Segmental and somatic dysfunction of pelvic region: Secondary | ICD-10-CM | POA: Diagnosis not present

## 2021-09-17 DIAGNOSIS — M5442 Lumbago with sciatica, left side: Secondary | ICD-10-CM | POA: Diagnosis not present

## 2021-09-17 DIAGNOSIS — M9902 Segmental and somatic dysfunction of thoracic region: Secondary | ICD-10-CM | POA: Diagnosis not present

## 2021-09-17 DIAGNOSIS — M9906 Segmental and somatic dysfunction of lower extremity: Secondary | ICD-10-CM | POA: Diagnosis not present

## 2021-09-21 DIAGNOSIS — D229 Melanocytic nevi, unspecified: Secondary | ICD-10-CM | POA: Diagnosis not present

## 2021-09-21 DIAGNOSIS — L578 Other skin changes due to chronic exposure to nonionizing radiation: Secondary | ICD-10-CM | POA: Diagnosis not present

## 2021-09-21 DIAGNOSIS — L821 Other seborrheic keratosis: Secondary | ICD-10-CM | POA: Diagnosis not present

## 2021-09-21 DIAGNOSIS — L570 Actinic keratosis: Secondary | ICD-10-CM | POA: Diagnosis not present

## 2021-09-22 DIAGNOSIS — M7661 Achilles tendinitis, right leg: Secondary | ICD-10-CM | POA: Diagnosis not present

## 2021-09-22 DIAGNOSIS — M9905 Segmental and somatic dysfunction of pelvic region: Secondary | ICD-10-CM | POA: Diagnosis not present

## 2021-09-22 DIAGNOSIS — M9903 Segmental and somatic dysfunction of lumbar region: Secondary | ICD-10-CM | POA: Diagnosis not present

## 2021-09-22 DIAGNOSIS — M5442 Lumbago with sciatica, left side: Secondary | ICD-10-CM | POA: Diagnosis not present

## 2021-09-22 DIAGNOSIS — M9906 Segmental and somatic dysfunction of lower extremity: Secondary | ICD-10-CM | POA: Diagnosis not present

## 2021-09-22 DIAGNOSIS — M9902 Segmental and somatic dysfunction of thoracic region: Secondary | ICD-10-CM | POA: Diagnosis not present

## 2021-09-22 DIAGNOSIS — M4606 Spinal enthesopathy, lumbar region: Secondary | ICD-10-CM | POA: Diagnosis not present

## 2021-10-01 ENCOUNTER — Encounter: Payer: Self-pay | Admitting: Sports Medicine

## 2021-10-01 ENCOUNTER — Ambulatory Visit: Payer: BC Managed Care – PPO | Admitting: Sports Medicine

## 2021-10-01 VITALS — BP 118/54 | Ht 67.0 in | Wt 140.0 lb

## 2021-10-01 DIAGNOSIS — M5136 Other intervertebral disc degeneration, lumbar region: Secondary | ICD-10-CM | POA: Diagnosis not present

## 2021-10-01 MED ORDER — PREDNISONE 20 MG PO TABS: 20.0000 mg | ORAL_TABLET | Freq: Two times a day (BID) | ORAL | 0 refills | Status: AC

## 2021-10-01 MED ORDER — GABAPENTIN 600 MG PO TABS: 600.0000 mg | ORAL_TABLET | Freq: Every day | ORAL | 12 refills | Status: DC

## 2021-10-01 NOTE — Progress Notes (Signed)
° °  Subjective:    Patient ID: George Ramos, male    DOB: 08/14/1956, 65 y.o.   MRN: [id number]  HPI Gabapentin refill Patient requests refills on his gabapentin, which he takes for severe degenerative disc disease of his lumbar spine. Currently taking 600mg  daily at bedtime which he finds helpful. He has trialed off the gabapentin in the past but this caused worsening of his pain. Dr. Oneida Alar, whom he has seen for many years, felt he would likely need to stay on the gabapentin indefinitely.  Right posterior leg pain Patient reports it has bothered him for 5 years, but he has never been seen for it. Previously only hurt when he drove long distances. He would need to stop every 2 hours to walk around, which would relieve the pain within 2-5 minutes. However, over the past 1-2 months, the pain occurs when he drives short distances, including around town (20 minutes). Described as an ache or throbbing in his right posterior hamstring region. Not painful to touch. He denies any recent trauma or injury. 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 No numbness, tingling, or radiation of pain into the calf/foot. He tried some "sciatic flossing" exercises that his chiropractor mentioned, which seemed to make the pain worse.      Objective:   Physical Exam  Back Exam:  Inspection: No gross deformity or asymmetry. No skin changes.  Palpation: no TTP over spinous processes, paraspinal muscles, or SI joints bilaterally. Range of Motion: Normal ROM with flexion, extension, rotation, and lateral bending Leg strength: Quad: 5/5 Hamstring: 5/5 Hip flexor: 5/5 Hip abductors: 5/5  Sensory change: Gross sensation intact to all lumbar and sacral dermatomes.  Gait unremarkable. Straight leg raise: Negative  FABER: negative. No TTP over hamstring, buttocks, or greater trochanter.     Assessment & Plan:  Lumbar Degenerative Disc Disease Chronic, stable. Patient doing well  on his current regimen of Gabapentin 600mg  at bedtime. Refills provided today.   2. Right Posterior Leg Pain Present x5 years but acutely worse over past 6-8 weeks. Symptoms brought on by driving. No pain with tennis or other activity. Exam largely unremarkable today including no TTP over hamstring or buttocks, 5/5 strength in hamstrings, negative straight leg raise. Given MRI findings in 2019 showing "right-sided disc osteophyte complex at L5-S1 in close proximity to exiting L5 nerve root", suspect his leg pain is related to flare of his back pathology. Has previously responded well to short course of prednisone. Rx sent for prednisone 20mg  BID x7 days. Return if no improvement.   Alcus Dad, MD PGY-2 [facility]  Patient seen and evaluated with the resident.  I agree with the above plan of care.  We will refill his gabapentin to take at night.  He has been stable on 600 mg at bedtime for many years.  We will also repeat a 7-day course of prednisone for his posterior right leg pain.  Hopefully this will allow him to drive further than 30 minutes before having to stop to get out of the car.  If he continues to have discomfort at the completion of his steroids then he will return to the office for reevaluation.

## 2021-10-01 NOTE — Patient Instructions (Signed)
It was great to meet you!  We have refilled your Gabapentin for a one year supply.   With regard to your right posterior leg pain, we suspect this is actually related to some of the changes in your back. Although it's true this may be from your sciatic nerve, avoid doing any exercises that aggravate the pain. We will treat you with a short course of steroids (prednisone, 20mg  twice daily for 7 days) to try to relieve some of the inflammation from your current flare.  If this is not helpful, please let us know and we will investigate other options so you're ready to go for tennis season.   Take care, Dr. Rock Nephew and Dr. Micheline Chapman

## 2021-10-28 DIAGNOSIS — R350 Frequency of micturition: Secondary | ICD-10-CM | POA: Diagnosis not present

## 2021-10-28 DIAGNOSIS — N401 Enlarged prostate with lower urinary tract symptoms: Secondary | ICD-10-CM | POA: Diagnosis not present

## 2021-11-04 DIAGNOSIS — N401 Enlarged prostate with lower urinary tract symptoms: Secondary | ICD-10-CM | POA: Diagnosis not present

## 2021-11-04 DIAGNOSIS — R3912 Poor urinary stream: Secondary | ICD-10-CM | POA: Diagnosis not present

## 2021-11-04 DIAGNOSIS — R350 Frequency of micturition: Secondary | ICD-10-CM | POA: Diagnosis not present

## 2021-11-04 DIAGNOSIS — C61 Malignant neoplasm of prostate: Secondary | ICD-10-CM | POA: Diagnosis not present

## 2021-11-12 ENCOUNTER — Ambulatory Visit: Payer: BC Managed Care – PPO | Admitting: Sports Medicine

## 2021-11-12 VITALS — BP 119/46 | Ht 67.0 in | Wt 135.0 lb

## 2021-11-12 DIAGNOSIS — M5136 Other intervertebral disc degeneration, lumbar region: Secondary | ICD-10-CM | POA: Diagnosis not present

## 2021-11-12 DIAGNOSIS — R634 Abnormal weight loss: Secondary | ICD-10-CM | POA: Diagnosis not present

## 2021-11-12 MED ORDER — GABAPENTIN 600 MG PO TABS: 600.0000 mg | ORAL_TABLET | Freq: Two times a day (BID) | ORAL | 3 refills | Status: DC

## 2021-11-12 NOTE — Assessment & Plan Note (Signed)
Likely related to lack of protein in diet.  We will increase this by taking creatine 5 g/day.  We will have him try this for about a month and see if this is helpful.

## 2021-11-12 NOTE — Patient Instructions (Signed)
Thank you for coming to see me today. It was a pleasure. Today we talked about:   Use a creatine supplement, doing 5 grams/day. Try this for about a month and see if this makes a difference with your weight.  We will increase her gabapentin to 600 mg twice daily.  On the day that you are going to fly, try this 3 times as it will likely get worse while you are sitting in the airplane seat.  Think back to some of the exercises that you did and if flexion ones or extension once help more, you can do these.  Please follow-up with [place] in 6 to 8 weeks.  If you have any questions or concerns, please do not hesitate to call the office at [phone].  Best,   Arizona Constable, DO [place]

## 2021-11-12 NOTE — Assessment & Plan Note (Signed)
Prior MRI from 2019 reviewed with the patient.  Suspect that his symptoms are coming from his L5 nerve root.  He does not have any weakness on exam today which is reassuring.  The majority of his symptoms also only happen when he is sitting for long periods which is reassuring.  We will try to increase his gabapentin to 600 mg twice daily.  He does have a long flight coming up, recommended taking it 3 times daily on this day.  He has been given exercises in the past, recommend doing flexion or extension based exercises if he finds these helpful.  Follow-up in 6 to 8 weeks.

## 2021-11-12 NOTE — Progress Notes (Signed)
° °  George Ramos is a 66 y.o. male who presents to [facility] today for the following:  Low back pain History of degenerative disc disease Last seen for the same on 12/22 Was given a refill of 600 mg gabapentin at bedtime and also given a course of prednisone for an acute flare of posterior right leg pain related to the L5 nerve root States the prednisone only helped for about 2 weeks Continues to have right posterior leg pain Always stops above the knee No numbness and tingling Reports that it is a throbbing pain He denies changes in bowel or bladder habits, saddle anesthesias, leg weakness He is able to exercise, play tennis, 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 States that the pain only bothers him when he is sitting for a long time and driving He currently takes gabapentin 600 mg nightly, he has been on higher doses in the past, but has titrated down to this  Weight loss Patient reports that he has been having some weight loss over time States that in college she was about 150 to 160 pounds Now feels that it is difficult for him to maintain a weight of 135 pounds States that usually he will eat some sort of lean meat, veggies and rice States he does not eat a lot of red meat He has not noticed any difference in his strength or endurance Overall he is incredibly active, plays tennis, walks multiple times a day, also works Architect   PMH reviewed.  ROS as above. Medications reviewed.  Exam:  BP (!) 119/46    Ht 5\' 7"  (1.702 m)    Wt 135 lb (61.2 kg)    BMI 21.14 kg/m  Gen: Well NAD MSK:  Lumbar spine: - Inspection: no gross deformity or asymmetry, swelling or ecchymosis - Palpation: No TTP over the spinous processes, paraspinal muscles, or SI joints b/l - ROM: Flexion is limited by about 20 degrees secondary to hamstring tightness, full extension without pain - Strength: 5/5 strength of lower extremity in L4-S1 nerve root distributions b/l; normal gait - Neuro: sensation  intact in the L4-S1 nerve root distribution b/l, 2+ L4 and S1 reflexes - Special testing: Slightly positive straight leg raise on right, negative slump   No results found.   Assessment and Plan: 1) Degenerative disc disease, lumbar Prior MRI from 2019 reviewed with the patient.  Suspect that his symptoms are coming from his L5 nerve root.  He does not have any weakness on exam today which is reassuring.  The majority of his symptoms also only happen when he is sitting for long periods which is reassuring.  We will try to increase his gabapentin to 600 mg twice daily.  He does have a long flight coming up, recommended taking it 3 times daily on this day.  He has been given exercises in the past, recommend doing flexion or extension based exercises if he finds these helpful.  Follow-up in 6 to 8 weeks.  Weight loss Likely related to lack of protein in diet.  We will increase this by taking creatine 5 g/day.  We will have him try this for about a month and see if this is helpful.   Arizona Constable, D.O.  PGY-4 [facility]  11/12/2021 3:35 PM  I observed and examined the patient with the [facility] resident and agree with assessment and plan.  Note reviewed and modified by me. Ila Mcgill, MD

## 2021-11-30 DIAGNOSIS — H2513 Age-related nuclear cataract, bilateral: Secondary | ICD-10-CM | POA: Diagnosis not present

## 2021-12-29 ENCOUNTER — Ambulatory Visit (INDEPENDENT_AMBULATORY_CARE_PROVIDER_SITE_OTHER): Payer: BC Managed Care – PPO | Admitting: Sports Medicine

## 2021-12-29 VITALS — BP 112/44 | Ht 67.0 in | Wt 135.0 lb

## 2021-12-29 DIAGNOSIS — R634 Abnormal weight loss: Secondary | ICD-10-CM

## 2021-12-29 DIAGNOSIS — M5136 Other intervertebral disc degeneration, lumbar region: Secondary | ICD-10-CM | POA: Diagnosis not present

## 2021-12-29 DIAGNOSIS — H8110 Benign paroxysmal vertigo, unspecified ear: Secondary | ICD-10-CM

## 2021-12-29 MED ORDER — GABAPENTIN 600 MG PO TABS: 600.0000 mg | ORAL_TABLET | Freq: Three times a day (TID) | ORAL | 3 refills | Status: DC

## 2021-12-29 NOTE — Patient Instructions (Signed)
It was great to see you today, thank you for letting me participate in your care! ? ?Today, we discussed your back/leg pain and your vertigo. ? ?Things for you to do: ?-Try a supportive pillow or a triangular reflux pillow when driving for the low back ?-We will increase her gabapentin from 1 tablet twice a day to 3 times a day (total of '1800mg'$ ) ?-You may continue taking the creatine, 1 scoop or 1 tablet daily.  Most creatine supplements are 5 g in a single dose. ?-For your vertigo, you may try the rapid attenuation set up exercises that Dr. Oneida Alar was demonstrating for you ? ?You will follow-up with Dr. Oneida Alar if not improving over the next 2-3 months ? ?If you have any further questions, please give the clinic a call [phone]. ? ?Elba Barman, DO ?[place] ? ?

## 2021-12-29 NOTE — Progress Notes (Signed)
PCP: London Pepper, MD ? ?Subjective:  ? ?HPI: ?George Ramos is a 66 y.o. male here for follow-up of low back pain with right-sided radiculopathy. ? ?Low back pain and right posterior leg pain - this has been present for a few years.  Since the last visit back in February, he did increase from 300 mg of gabapentin to 600 mg twice daily.  He does states that he feel like this helped improve some of his posterior leg pain although still persist.  His pain is worse with prolonged sitting such as driving in the car for more than 45 minutes.  He denies any weakness or giving out of the leg.  Pain starts near the buttock area and shoots down to the posterior aspect just above the popliteal fossa.  He denies any numbness or tingling or radiation into the foot.  ? ?Weight loss -during their last visit he was recommended to take creatine 5 g daily.  He did try to get an over-the-counter creatine supplement, although was having difficulty knowing exactly how much creatine was in the supplements.  He has been taking a scoop a few times a week in the past, however over the last few weeks he has not been exercising due to bouts of vertigo and so has not been taking the creatine supplement.  He does believe he has gained a a few pounds since taking this.  He continues to be very active, walking, playing tennis he does also work Architect. ? ?BP (!) 112/44   Ht '5\' 7"'$  (1.702 m)   Wt 135 lb (61.2 kg)   BMI 21.14 kg/m?  ? ?No flowsheet data found. ? ?No flowsheet data found. ? ?    ?Objective:  ?Physical Exam: ? ?Weight today: 142 lbs  ? ?Gen: Well-appearing, in no acute distress; non-toxic ?CV: Regular Rate. Well-perfused. Warm.  ?Resp: Breathing unlabored on room air; no wheezing. ?Psych: Fluid speech in conversation; appropriate affect; normal thought process ?Neuro: Sensation intact throughout. No gross coordination deficits.  ?MSK:  ?- Lumbar spine: No gross deformity or asymmetry.  There is no tenderness to palpation over the  spinous process or paraspinal muscles.  He has some mild limitation in forward flexion, extension does not cause any pain.  He has 5/5 strength bilateral lower extremities in the L4-S1 nerve root distribution, although has some reproduction of pain with resisted knee flexion.  Sensation to light touch intact throughout the entire lower extremities.  He does have equivocal patellar and Achilles tendon reflexes.  Negative straight leg raise bilaterally. ? ?  ?Assessment & Plan:  ?1. Low back pain with left-sided radiculopathy  ?2. Vertigo ?3.  Weight loss ? ?MRI from 02/02/2018 demonstrates: Slightly worsened extraforaminal right-sided disc osteophyte ?complex at L5-S1, in close proximity to the exiting L5 nerve root. ? ?Plan: ?-Try a supportive pillow or a triangular reflux pillow when driving for the low back ?-We will increase gabapentin from 1 tablet twice a day to 3 times a day (total of '1800mg'$ ) ?-continue taking the creatine, 1 scoop or 1 tablet daily.  Most creatine supplements are 5 g in a single dose ?- rapid attenuation position changes demonstrated for patient to perform once daily for vertigo treatment ?- continue HEP for low back/sciatica as tolerated ?- follow-up in 2-3 months ? ?Elba Barman, DO ?PGY-4, Sports Medicine Fellow ?[place] ? ?This note was dictated using Dragon naturally speaking software and may contain errors in syntax, spelling, or content which have not been identified  prior to signing this note.  ? ?I observed and examined the patient with the [facility] resident and agree with assessment and plan.  Note reviewed and modified by me. ?Ila Mcgill, MD ? ? ?

## 2022-01-13 ENCOUNTER — Other Ambulatory Visit: Payer: Self-pay | Admitting: Urology

## 2022-01-13 DIAGNOSIS — C61 Malignant neoplasm of prostate: Secondary | ICD-10-CM

## 2022-01-14 ENCOUNTER — Ambulatory Visit: Payer: BC Managed Care – PPO | Admitting: Sports Medicine

## 2022-02-02 DIAGNOSIS — H25013 Cortical age-related cataract, bilateral: Secondary | ICD-10-CM | POA: Diagnosis not present

## 2022-02-02 DIAGNOSIS — H25043 Posterior subcapsular polar age-related cataract, bilateral: Secondary | ICD-10-CM | POA: Diagnosis not present

## 2022-02-02 DIAGNOSIS — H2513 Age-related nuclear cataract, bilateral: Secondary | ICD-10-CM | POA: Diagnosis not present

## 2022-02-02 DIAGNOSIS — H18413 Arcus senilis, bilateral: Secondary | ICD-10-CM | POA: Diagnosis not present

## 2022-02-02 DIAGNOSIS — H2512 Age-related nuclear cataract, left eye: Secondary | ICD-10-CM | POA: Diagnosis not present

## 2022-02-22 ENCOUNTER — Ambulatory Visit
Admission: RE | Admit: 2022-02-22 | Discharge: 2022-02-22 | Disposition: A | Discharge status: Home / Self Care | Payer: PPO | Source: Ambulatory Visit | Attending: Urology | Admitting: Urology

## 2022-02-22 DIAGNOSIS — C61 Malignant neoplasm of prostate: Secondary | ICD-10-CM

## 2022-02-22 DIAGNOSIS — R972 Elevated prostate specific antigen [PSA]: Secondary | ICD-10-CM | POA: Diagnosis not present

## 2022-02-22 MED ORDER — GADOBENATE DIMEGLUMINE 529 MG/ML IV SOLN
13.0000 mL | Freq: Once | INTRAVENOUS | Status: AC | PRN
  Administered 2022-02-22: 13 mL via INTRAVENOUS

## 2022-02-26 DIAGNOSIS — C61 Malignant neoplasm of prostate: Secondary | ICD-10-CM | POA: Diagnosis not present

## 2022-03-23 ENCOUNTER — Ambulatory Visit (INDEPENDENT_AMBULATORY_CARE_PROVIDER_SITE_OTHER): Payer: PPO | Admitting: Sports Medicine

## 2022-03-23 ENCOUNTER — Ambulatory Visit: Payer: Self-pay

## 2022-03-23 VITALS — BP 112/78 | Ht 67.0 in | Wt 140.0 lb

## 2022-03-23 DIAGNOSIS — D2261 Melanocytic nevi of right upper limb, including shoulder: Secondary | ICD-10-CM | POA: Diagnosis not present

## 2022-03-23 DIAGNOSIS — M79644 Pain in right finger(s): Secondary | ICD-10-CM

## 2022-03-23 DIAGNOSIS — L821 Other seborrheic keratosis: Secondary | ICD-10-CM | POA: Diagnosis not present

## 2022-03-23 DIAGNOSIS — Z86007 Personal history of in-situ neoplasm of skin: Secondary | ICD-10-CM | POA: Diagnosis not present

## 2022-03-23 DIAGNOSIS — M79641 Pain in right hand: Secondary | ICD-10-CM

## 2022-03-23 DIAGNOSIS — Z08 Encounter for follow-up examination after completed treatment for malignant neoplasm: Secondary | ICD-10-CM | POA: Diagnosis not present

## 2022-03-23 DIAGNOSIS — D2262 Melanocytic nevi of left upper limb, including shoulder: Secondary | ICD-10-CM | POA: Diagnosis not present

## 2022-03-23 NOTE — Progress Notes (Signed)
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  This is at the MCP joint to his index finger.  He does not note any swelling.  He does remember a specific injury where he slipped and had to grab the hand around the rung of a ladder.  He also gets pain when playing mandolin and using a pick.  Recent history of BPH and started on finasteride.  He did have an elevated PSA and on biopsy has some early prostate cancer with a Gleason score of 6.  He is then a watchful waiting protocol and his PSA has dropped below 4  Review of systems-no similar pain on his left hand/no redness/no sensory loss  Physical exam Athletic older male in no acute distress BP 112/78   Ht '5\' 7"'$  (1.702 m)   Wt 140 lb (63.5 kg)   BMI 21.93 kg/m   His hand shows full flexion and extension There is no swelling over any of his MCP joints Palpation of the right index MCP joint does reveal a little mild tenderness on the palmar side Normal flexion extension of his index finger without locking Normal grip strength  Ultrasound of the right hand limited I was able to visualize the MCP joint 2 on the palmar dorsal and medial and lateral surfaces On the dorsal surface there is a very small spur probably a millimeter There is some minor calcification Extensor tendon looks intact No joint effusion Palmar surface shows an intact flexor tendon On the phalangeal side there is a soft tissue irregularity with a cystic center that is somewhat irregular with slight calcification On the radial and ulnar side of the MCP there were no abnormalities  Ultrasound and interpretation by Wolfgang Phoenix. Oneida Alar, MD

## 2022-03-23 NOTE — Assessment & Plan Note (Signed)
I reassured him that I did not see significant arthritis I think he may want to try buddy taping the finger for tennis and maybe even mandolin Topical Voltaren could be useful since this is superficial I do think he had a remote soft tissue injury that may be contributing to some of the pain He will check back if not resolving

## 2022-04-29 DIAGNOSIS — H2512 Age-related nuclear cataract, left eye: Secondary | ICD-10-CM | POA: Diagnosis not present

## 2022-04-30 DIAGNOSIS — H2511 Age-related nuclear cataract, right eye: Secondary | ICD-10-CM | POA: Diagnosis not present

## 2022-04-30 DIAGNOSIS — H2512 Age-related nuclear cataract, left eye: Secondary | ICD-10-CM | POA: Diagnosis not present

## 2022-05-13 DIAGNOSIS — H2511 Age-related nuclear cataract, right eye: Secondary | ICD-10-CM | POA: Diagnosis not present

## 2022-05-14 DIAGNOSIS — H2511 Age-related nuclear cataract, right eye: Secondary | ICD-10-CM | POA: Diagnosis not present

## 2022-05-24 DIAGNOSIS — C61 Malignant neoplasm of prostate: Secondary | ICD-10-CM | POA: Diagnosis not present

## 2022-05-24 DIAGNOSIS — Z0000 Encounter for general adult medical examination without abnormal findings: Secondary | ICD-10-CM | POA: Diagnosis not present

## 2022-05-24 DIAGNOSIS — R001 Bradycardia, unspecified: Secondary | ICD-10-CM | POA: Diagnosis not present

## 2022-05-24 DIAGNOSIS — R7309 Other abnormal glucose: Secondary | ICD-10-CM | POA: Diagnosis not present

## 2022-05-24 DIAGNOSIS — Z136 Encounter for screening for cardiovascular disorders: Secondary | ICD-10-CM | POA: Diagnosis not present

## 2022-06-01 DIAGNOSIS — R052 Subacute cough: Secondary | ICD-10-CM | POA: Diagnosis not present

## 2022-06-10 DIAGNOSIS — L821 Other seborrheic keratosis: Secondary | ICD-10-CM | POA: Diagnosis not present

## 2022-08-24 ENCOUNTER — Other Ambulatory Visit: Payer: Self-pay | Admitting: Sports Medicine

## 2022-08-24 DIAGNOSIS — C61 Malignant neoplasm of prostate: Secondary | ICD-10-CM | POA: Diagnosis not present

## 2022-08-31 DIAGNOSIS — C61 Malignant neoplasm of prostate: Secondary | ICD-10-CM | POA: Diagnosis not present

## 2022-08-31 DIAGNOSIS — R3912 Poor urinary stream: Secondary | ICD-10-CM | POA: Diagnosis not present

## 2022-08-31 DIAGNOSIS — R350 Frequency of micturition: Secondary | ICD-10-CM | POA: Diagnosis not present

## 2022-08-31 DIAGNOSIS — N401 Enlarged prostate with lower urinary tract symptoms: Secondary | ICD-10-CM | POA: Diagnosis not present

## 2022-09-08 ENCOUNTER — Other Ambulatory Visit: Payer: Self-pay

## 2022-09-08 MED ORDER — GABAPENTIN 600 MG PO TABS: 600.0000 mg | ORAL_TABLET | Freq: Three times a day (TID) | ORAL | 3 refills | Status: DC

## 2022-09-22 DIAGNOSIS — Z85828 Personal history of other malignant neoplasm of skin: Secondary | ICD-10-CM | POA: Diagnosis not present

## 2022-09-22 DIAGNOSIS — D229 Melanocytic nevi, unspecified: Secondary | ICD-10-CM | POA: Diagnosis not present

## 2022-09-22 DIAGNOSIS — C44329 Squamous cell carcinoma of skin of other parts of face: Secondary | ICD-10-CM | POA: Diagnosis not present

## 2022-09-22 DIAGNOSIS — R229 Localized swelling, mass and lump, unspecified: Secondary | ICD-10-CM | POA: Diagnosis not present

## 2022-09-22 DIAGNOSIS — L821 Other seborrheic keratosis: Secondary | ICD-10-CM | POA: Diagnosis not present

## 2022-09-22 DIAGNOSIS — Z08 Encounter for follow-up examination after completed treatment for malignant neoplasm: Secondary | ICD-10-CM | POA: Diagnosis not present

## 2022-10-21 DIAGNOSIS — S0502XA Injury of conjunctiva and corneal abrasion without foreign body, left eye, initial encounter: Secondary | ICD-10-CM | POA: Diagnosis not present

## 2023-02-22 DIAGNOSIS — C61 Malignant neoplasm of prostate: Secondary | ICD-10-CM | POA: Diagnosis not present

## 2023-03-01 DIAGNOSIS — R3912 Poor urinary stream: Secondary | ICD-10-CM | POA: Diagnosis not present

## 2023-03-01 DIAGNOSIS — N401 Enlarged prostate with lower urinary tract symptoms: Secondary | ICD-10-CM | POA: Diagnosis not present

## 2023-03-01 DIAGNOSIS — C61 Malignant neoplasm of prostate: Secondary | ICD-10-CM | POA: Diagnosis not present

## 2023-03-01 DIAGNOSIS — N5201 Erectile dysfunction due to arterial insufficiency: Secondary | ICD-10-CM | POA: Diagnosis not present

## 2023-04-12 DIAGNOSIS — Z85828 Personal history of other malignant neoplasm of skin: Secondary | ICD-10-CM | POA: Diagnosis not present

## 2023-04-12 DIAGNOSIS — L298 Other pruritus: Secondary | ICD-10-CM | POA: Diagnosis not present

## 2023-04-12 DIAGNOSIS — D2261 Melanocytic nevi of right upper limb, including shoulder: Secondary | ICD-10-CM | POA: Diagnosis not present

## 2023-04-12 DIAGNOSIS — D2262 Melanocytic nevi of left upper limb, including shoulder: Secondary | ICD-10-CM | POA: Diagnosis not present

## 2023-04-12 DIAGNOSIS — Z08 Encounter for follow-up examination after completed treatment for malignant neoplasm: Secondary | ICD-10-CM | POA: Diagnosis not present

## 2023-04-12 DIAGNOSIS — C44329 Squamous cell carcinoma of skin of other parts of face: Secondary | ICD-10-CM | POA: Diagnosis not present

## 2023-04-12 DIAGNOSIS — R229 Localized swelling, mass and lump, unspecified: Secondary | ICD-10-CM | POA: Diagnosis not present

## 2023-04-12 DIAGNOSIS — D225 Melanocytic nevi of trunk: Secondary | ICD-10-CM | POA: Diagnosis not present

## 2023-04-30 IMAGING — MR MR PROSTATE WO/W CM
12 series · 48 of 48 positions shown · IV contrast (multihance)
Comparison: None.

CLINICAL DATA: Elevated PSA of 6.1 on 07/01/2020. Biopsy 8 years
ago, benign. Status post TURP in 3298.

EXAM:
MR PROSTATE WITHOUT AND WITH CONTRAST
TECHNIQUE: Multiplanar multisequence MRI images were obtained of the pelvis
centered about the prostate. Pre and post contrast images were
obtained.
CONTRAST:  13mL MULTIHANCE GADOBENATE DIMEGLUMINE 529 MG/ML IV SOLN

[Series 4: T2 · coronal · 3.0mm · 0.56mm/px · 1 of 23 slices shown (1 of 3)]
[im 1/23]
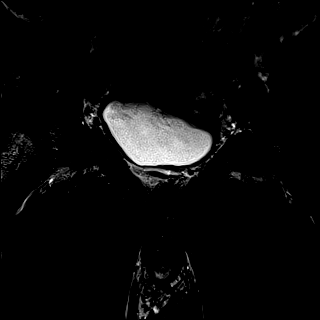

[Series 5: T1 · axial · 5.0mm · 1.25mm/px · z∈[-77,+118]mm · 2 of 80 slices shown]
[im 1/80]
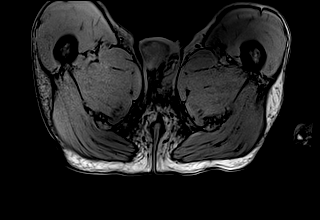
[im 80/80]
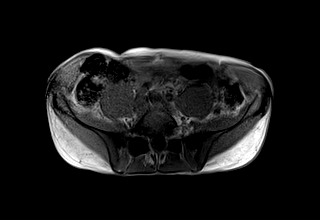

[Series 6: DWI · axial · 3.0mm · 1.75mm/px · z∈[-25,+32]mm · 2 of 59 slices shown (1 of 3)]
[im 1/59]
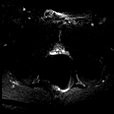
[im 59/59]
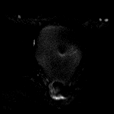

[Series 7: DWI · axial · 3.0mm · 1.75mm/px · 1 of 20 slices shown (2 of 3)]
[im 1/20]
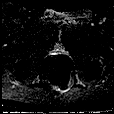

[Series 8: DWI · axial · 3.0mm · 1.75mm/px · 1 of 20 slices shown (3 of 3)]
[im 1/20]
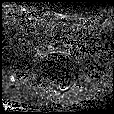

[Series 9: T2 · axial · 3.0mm · 0.56mm/px · 1 of 23 slices shown (2 of 3)]
[im 1/23]
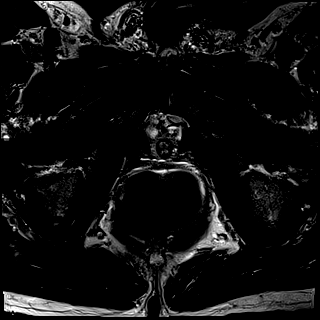

[Series 10: T2 · axial · 1.0mm · 1.04mm/px · z∈[-36,+43]mm · 2 of 80 slices shown (3 of 3)]
[im 1/80]
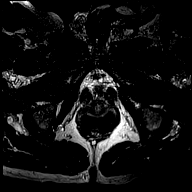
[im 80/80]
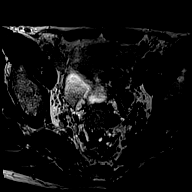

[Series 11: pre t1_twist_tra_dyn · axial · non-contrast · 3.5mm · 0.83mm/px · 1 of 18 slices shown]
[im 1/18]
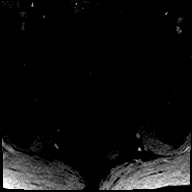

[Series 12: post t1_twist_tra_dyn-copy center · axial · non-contrast · 3.5mm · 0.83mm/px · z∈[-27,+33]mm · 17 of 540 slices shown]
[im 1/540]
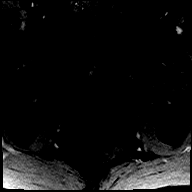
[im 34/540]
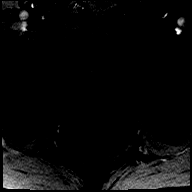
[im 68/540]
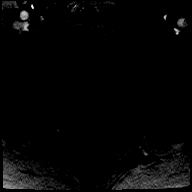
[im 102/540]
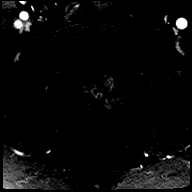
[im 135/540]
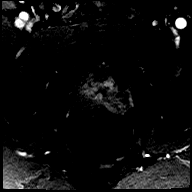
[im 169/540]
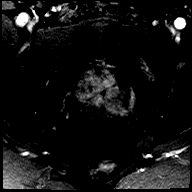
[im 203/540]
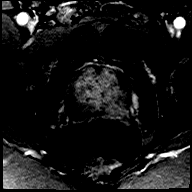
[im 236/540]
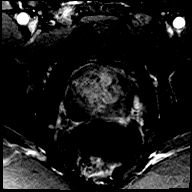
[im 270/540]
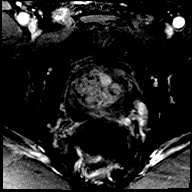
[im 304/540]
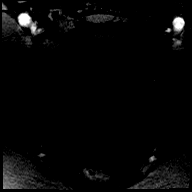
[im 337/540]
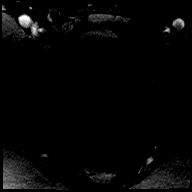
[im 371/540]
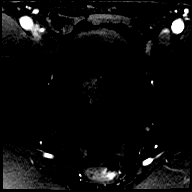
[im 405/540]
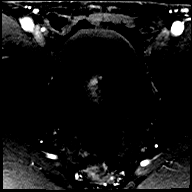
[im 438/540]
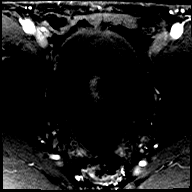
[im 472/540]
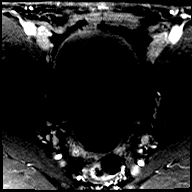
[im 506/540]
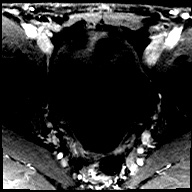
[im 540/540]
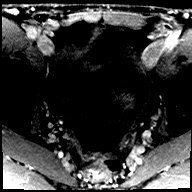

[Series 13: post t1_twist_tra_dyn-copy cent_sub · axial · 3.5mm · 0.83mm/px · z∈[-27,+33]mm · 16 of 522 slices shown]
[im 1/522]
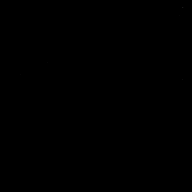
[im 35/522]
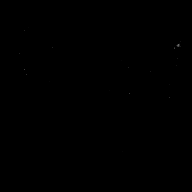
[im 70/522]
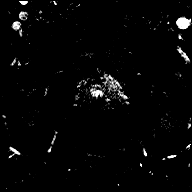
[im 105/522]
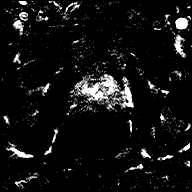
[im 139/522]
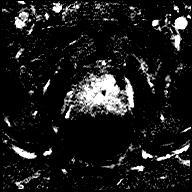
[im 174/522]
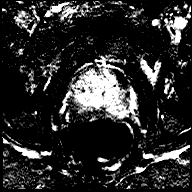
[im 209/522]
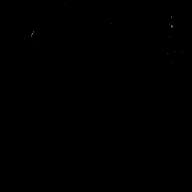
[im 244/522]
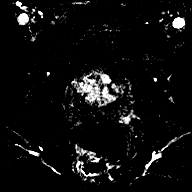
[im 278/522]
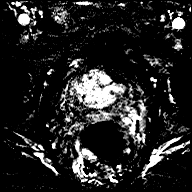
[im 313/522]
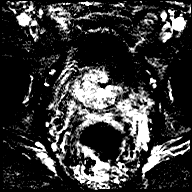
[im 348/522]
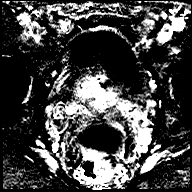
[im 383/522]
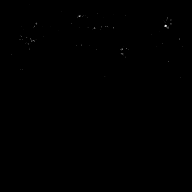
[im 417/522]
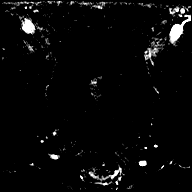
[im 452/522]
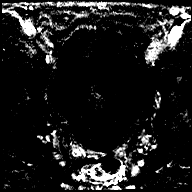
[im 487/522]
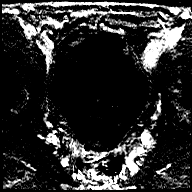
[im 522/522]
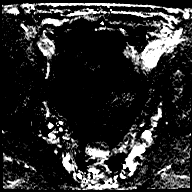

[Series 14: t1_vibe_dixon_tra_f · axial · 2.5mm · 0.91mm/px · z∈[-78,+120]mm · 2 of 80 slices shown]
[im 1/80]
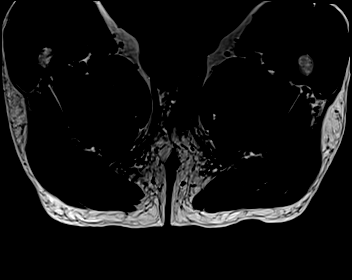
[im 80/80]
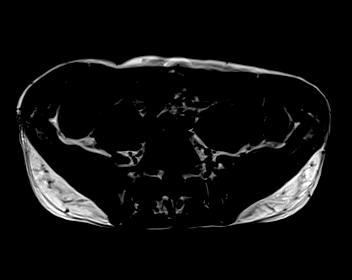

[Series 15: t1_vibe_dixon_tra_w · axial · 2.5mm · 0.91mm/px · z∈[-78,+120]mm · 2 of 80 slices shown]
[im 1/80]
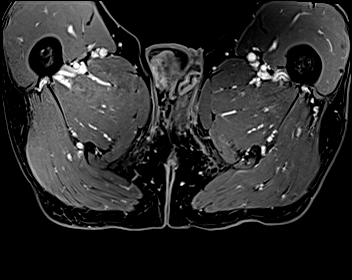
[im 80/80]
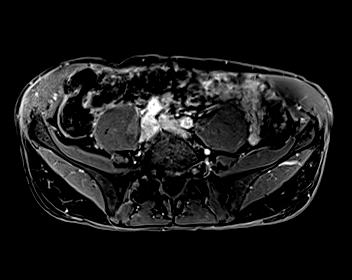

[48 of 48 positions shown; findings below may reference images not displayed]

FINDINGS: Prostate: Eccentric left TURP defect, including on [DATE]. Mild to
moderate central gland enlargement and heterogeneity, consistent
with benign prostatic hyperplasia. No dominant central gland nodule.

Vague, somewhat geographic T2 hypointensity involves the lateral
left mid gland, including at up to 1.4 cm on [DATE]. This corresponds
to mildly decreased signal on ADC map including [DATE]. Early
post-contrast enhancement, including on 175/13. PI-RADS(v2.1)-4.

Volume: 5.9 x 4.8 x 5.0 cm (volume = 74 cm^3)

Transcapsular spread:  Absent

Seminal vesicle involvement: Absent

Neurovascular bundle involvement: Absent

Pelvic adenopathy: Absent

Bone metastasis: Absent

Other findings: No significant free fluid.  Normal urinary bladder.
IMPRESSION: 1. Left lateral mid gland peripheral zone multiparametric signal
abnormality, PI-RADS(v2.1)-4. Given ill-defined, geographic T2
morphology, focal prostatitis is also a consideration.
2. No locally advanced or pelvic metastatic disease.

## 2023-06-03 DIAGNOSIS — Z0000 Encounter for general adult medical examination without abnormal findings: Secondary | ICD-10-CM | POA: Diagnosis not present

## 2023-06-03 DIAGNOSIS — C61 Malignant neoplasm of prostate: Secondary | ICD-10-CM | POA: Diagnosis not present

## 2023-06-03 DIAGNOSIS — R7309 Other abnormal glucose: Secondary | ICD-10-CM | POA: Diagnosis not present

## 2023-06-03 DIAGNOSIS — E785 Hyperlipidemia, unspecified: Secondary | ICD-10-CM | POA: Diagnosis not present

## 2023-06-03 DIAGNOSIS — R001 Bradycardia, unspecified: Secondary | ICD-10-CM | POA: Diagnosis not present

## 2023-06-07 ENCOUNTER — Other Ambulatory Visit: Payer: Self-pay | Admitting: Sports Medicine

## 2023-06-14 ENCOUNTER — Other Ambulatory Visit: Payer: Self-pay

## 2023-06-14 MED ORDER — GABAPENTIN 600 MG PO TABS: 600.0000 mg | ORAL_TABLET | Freq: Three times a day (TID) | ORAL | 0 refills | Status: DC

## 2023-06-18 DIAGNOSIS — J069 Acute upper respiratory infection, unspecified: Secondary | ICD-10-CM | POA: Diagnosis not present

## 2023-06-18 DIAGNOSIS — Z20822 Contact with and (suspected) exposure to covid-19: Secondary | ICD-10-CM | POA: Diagnosis not present

## 2023-06-18 DIAGNOSIS — M791 Myalgia, unspecified site: Secondary | ICD-10-CM | POA: Diagnosis not present

## 2023-06-21 DIAGNOSIS — R5381 Other malaise: Secondary | ICD-10-CM | POA: Diagnosis not present

## 2023-06-21 DIAGNOSIS — L089 Local infection of the skin and subcutaneous tissue, unspecified: Secondary | ICD-10-CM | POA: Diagnosis not present

## 2023-06-21 DIAGNOSIS — W57XXXA Bitten or stung by nonvenomous insect and other nonvenomous arthropods, initial encounter: Secondary | ICD-10-CM | POA: Diagnosis not present

## 2023-07-08 DIAGNOSIS — M160 Bilateral primary osteoarthritis of hip: Secondary | ICD-10-CM | POA: Diagnosis not present

## 2023-07-08 DIAGNOSIS — S76312A Strain of muscle, fascia and tendon of the posterior muscle group at thigh level, left thigh, initial encounter: Secondary | ICD-10-CM | POA: Diagnosis not present

## 2023-07-08 DIAGNOSIS — S76311A Strain of muscle, fascia and tendon of the posterior muscle group at thigh level, right thigh, initial encounter: Secondary | ICD-10-CM | POA: Diagnosis not present

## 2023-07-11 DIAGNOSIS — S76311D Strain of muscle, fascia and tendon of the posterior muscle group at thigh level, right thigh, subsequent encounter: Secondary | ICD-10-CM | POA: Diagnosis not present

## 2023-07-11 DIAGNOSIS — S76312D Strain of muscle, fascia and tendon of the posterior muscle group at thigh level, left thigh, subsequent encounter: Secondary | ICD-10-CM | POA: Diagnosis not present

## 2023-07-13 DIAGNOSIS — S76312D Strain of muscle, fascia and tendon of the posterior muscle group at thigh level, left thigh, subsequent encounter: Secondary | ICD-10-CM | POA: Diagnosis not present

## 2023-07-13 DIAGNOSIS — S76311D Strain of muscle, fascia and tendon of the posterior muscle group at thigh level, right thigh, subsequent encounter: Secondary | ICD-10-CM | POA: Diagnosis not present

## 2023-07-18 DIAGNOSIS — S76311D Strain of muscle, fascia and tendon of the posterior muscle group at thigh level, right thigh, subsequent encounter: Secondary | ICD-10-CM | POA: Diagnosis not present

## 2023-07-18 DIAGNOSIS — S76312D Strain of muscle, fascia and tendon of the posterior muscle group at thigh level, left thigh, subsequent encounter: Secondary | ICD-10-CM | POA: Diagnosis not present

## 2023-07-20 DIAGNOSIS — S76312D Strain of muscle, fascia and tendon of the posterior muscle group at thigh level, left thigh, subsequent encounter: Secondary | ICD-10-CM | POA: Diagnosis not present

## 2023-07-20 DIAGNOSIS — S76311D Strain of muscle, fascia and tendon of the posterior muscle group at thigh level, right thigh, subsequent encounter: Secondary | ICD-10-CM | POA: Diagnosis not present

## 2023-07-25 DIAGNOSIS — S76311D Strain of muscle, fascia and tendon of the posterior muscle group at thigh level, right thigh, subsequent encounter: Secondary | ICD-10-CM | POA: Diagnosis not present

## 2023-07-25 DIAGNOSIS — S76312D Strain of muscle, fascia and tendon of the posterior muscle group at thigh level, left thigh, subsequent encounter: Secondary | ICD-10-CM | POA: Diagnosis not present

## 2023-07-27 DIAGNOSIS — S76312D Strain of muscle, fascia and tendon of the posterior muscle group at thigh level, left thigh, subsequent encounter: Secondary | ICD-10-CM | POA: Diagnosis not present

## 2023-07-27 DIAGNOSIS — S76311D Strain of muscle, fascia and tendon of the posterior muscle group at thigh level, right thigh, subsequent encounter: Secondary | ICD-10-CM | POA: Diagnosis not present

## 2023-08-10 ENCOUNTER — Other Ambulatory Visit: Payer: Self-pay | Admitting: Sports Medicine

## 2023-08-22 DIAGNOSIS — S76311D Strain of muscle, fascia and tendon of the posterior muscle group at thigh level, right thigh, subsequent encounter: Secondary | ICD-10-CM | POA: Diagnosis not present

## 2023-08-22 DIAGNOSIS — S76312D Strain of muscle, fascia and tendon of the posterior muscle group at thigh level, left thigh, subsequent encounter: Secondary | ICD-10-CM | POA: Diagnosis not present

## 2023-08-26 DIAGNOSIS — S76312D Strain of muscle, fascia and tendon of the posterior muscle group at thigh level, left thigh, subsequent encounter: Secondary | ICD-10-CM | POA: Diagnosis not present

## 2023-08-26 DIAGNOSIS — S76311D Strain of muscle, fascia and tendon of the posterior muscle group at thigh level, right thigh, subsequent encounter: Secondary | ICD-10-CM | POA: Diagnosis not present

## 2023-08-29 DIAGNOSIS — S76311D Strain of muscle, fascia and tendon of the posterior muscle group at thigh level, right thigh, subsequent encounter: Secondary | ICD-10-CM | POA: Diagnosis not present

## 2023-08-29 DIAGNOSIS — S76312D Strain of muscle, fascia and tendon of the posterior muscle group at thigh level, left thigh, subsequent encounter: Secondary | ICD-10-CM | POA: Diagnosis not present

## 2023-08-31 DIAGNOSIS — S76311D Strain of muscle, fascia and tendon of the posterior muscle group at thigh level, right thigh, subsequent encounter: Secondary | ICD-10-CM | POA: Diagnosis not present

## 2023-08-31 DIAGNOSIS — S76312D Strain of muscle, fascia and tendon of the posterior muscle group at thigh level, left thigh, subsequent encounter: Secondary | ICD-10-CM | POA: Diagnosis not present

## 2023-09-05 DIAGNOSIS — S76312D Strain of muscle, fascia and tendon of the posterior muscle group at thigh level, left thigh, subsequent encounter: Secondary | ICD-10-CM | POA: Diagnosis not present

## 2023-09-05 DIAGNOSIS — S76311D Strain of muscle, fascia and tendon of the posterior muscle group at thigh level, right thigh, subsequent encounter: Secondary | ICD-10-CM | POA: Diagnosis not present

## 2023-09-12 DIAGNOSIS — S76312D Strain of muscle, fascia and tendon of the posterior muscle group at thigh level, left thigh, subsequent encounter: Secondary | ICD-10-CM | POA: Diagnosis not present

## 2023-09-12 DIAGNOSIS — S76311D Strain of muscle, fascia and tendon of the posterior muscle group at thigh level, right thigh, subsequent encounter: Secondary | ICD-10-CM | POA: Diagnosis not present

## 2023-09-14 DIAGNOSIS — S76311D Strain of muscle, fascia and tendon of the posterior muscle group at thigh level, right thigh, subsequent encounter: Secondary | ICD-10-CM | POA: Diagnosis not present

## 2023-09-14 DIAGNOSIS — S76312D Strain of muscle, fascia and tendon of the posterior muscle group at thigh level, left thigh, subsequent encounter: Secondary | ICD-10-CM | POA: Diagnosis not present

## 2023-09-19 DIAGNOSIS — S76312D Strain of muscle, fascia and tendon of the posterior muscle group at thigh level, left thigh, subsequent encounter: Secondary | ICD-10-CM | POA: Diagnosis not present

## 2023-09-19 DIAGNOSIS — S76311D Strain of muscle, fascia and tendon of the posterior muscle group at thigh level, right thigh, subsequent encounter: Secondary | ICD-10-CM | POA: Diagnosis not present

## 2023-09-21 DIAGNOSIS — C61 Malignant neoplasm of prostate: Secondary | ICD-10-CM | POA: Diagnosis not present

## 2023-09-21 DIAGNOSIS — S76311D Strain of muscle, fascia and tendon of the posterior muscle group at thigh level, right thigh, subsequent encounter: Secondary | ICD-10-CM | POA: Diagnosis not present

## 2023-09-21 DIAGNOSIS — S76312D Strain of muscle, fascia and tendon of the posterior muscle group at thigh level, left thigh, subsequent encounter: Secondary | ICD-10-CM | POA: Diagnosis not present

## 2023-09-26 DIAGNOSIS — N5201 Erectile dysfunction due to arterial insufficiency: Secondary | ICD-10-CM | POA: Diagnosis not present

## 2023-09-26 DIAGNOSIS — C61 Malignant neoplasm of prostate: Secondary | ICD-10-CM | POA: Diagnosis not present

## 2023-09-26 DIAGNOSIS — R350 Frequency of micturition: Secondary | ICD-10-CM | POA: Diagnosis not present

## 2023-09-26 DIAGNOSIS — N401 Enlarged prostate with lower urinary tract symptoms: Secondary | ICD-10-CM | POA: Diagnosis not present

## 2023-10-10 DIAGNOSIS — C61 Malignant neoplasm of prostate: Secondary | ICD-10-CM | POA: Diagnosis not present

## 2023-10-13 ENCOUNTER — Ambulatory Visit (INDEPENDENT_AMBULATORY_CARE_PROVIDER_SITE_OTHER): Payer: PPO | Admitting: Sports Medicine

## 2023-10-13 VITALS — BP 124/72 | Ht 67.0 in | Wt 140.0 lb

## 2023-10-13 DIAGNOSIS — S76302A Unspecified injury of muscle, fascia and tendon of the posterior muscle group at thigh level, left thigh, initial encounter: Secondary | ICD-10-CM | POA: Diagnosis not present

## 2023-10-13 DIAGNOSIS — M51362 Other intervertebral disc degeneration, lumbar region with discogenic back pain and lower extremity pain: Secondary | ICD-10-CM

## 2023-10-13 DIAGNOSIS — S76301A Unspecified injury of muscle, fascia and tendon of the posterior muscle group at thigh level, right thigh, initial encounter: Secondary | ICD-10-CM

## 2023-10-13 MED ORDER — GABAPENTIN 600 MG PO TABS: 600.0000 mg | ORAL_TABLET | Freq: Three times a day (TID) | ORAL | 2 refills | Status: DC

## 2023-10-13 NOTE — Progress Notes (Signed)
 PCP: Kip Righter, MD  SUBJECTIVE:   HPI:  Patient is a 68 y.o. male here with chief complaint of bilateral hamstring pain ongoing now for 6 months. He is an avid armed forces operational officer. He doesn't recall a specific injury, though noticed the pain following return after driving home from [facility] following a tennis tournament back in August '24. Locates his pain the the proximal posterior thighs bilaterally. Denies pain at rest or when laying down, but even light activity is limited by pain. Having difficulty tieing his shoes. He has had to cut back significantly in his playing of tennis. Initially evaluated by Dr. Irving at [facility] and was referred to PT back in October. He did PT and HEP for two months though didn't get much benefit from this, though mild improvement on the left.   He has significant lumbar foraminal stenosis, though denies a large burden of back pain or radicular symptoms.  He has historically managed this with Gabapentin  600mg  TID, though has cut back to 300mg  in the AM and 600mg  at bedtime. Needs refill of this.  ROS:     See HPI  PERTINENT  PMH / PSH FH / / SH:  Past Medical, Surgical, Social, and Family History Reviewed & Updated in the EMR.  Pertinent findings include:  Lumbar Stenosis  Past Surgical History:  Procedure Laterality Date   colonoscopy     COLONOSCOPY WITH PROPOFOL  N/A 09/16/2015   Procedure: COLONOSCOPY WITH PROPOFOL ;  Surgeon: Gladis MARLA Louder, MD;  Location: [facility];  Service: Endoscopy;  Laterality: N/A;   GREEN LIGHT LASER TURP (TRANSURETHRAL RESECTION OF PROSTATE N/A 11/20/2013   Procedure: GREEN LIGHT LASER TURP (TRANSURETHRAL RESECTION OF PROSTATE;  Surgeon: Donnice Gwenyth Brooks, MD;  Location: [facility];  Service: Urology;  Laterality: N/A;   INGUINAL HERNIA REPAIR  AGE 11   No Known Allergies  OBJECTIVE:  BP 124/72   Ht 5' 7 (1.702 m)   Wt 140 lb (63.5 kg)   BMI 21.93 kg/m   PHYSICAL EXAM:  GEN: Alert and  Oriented, NAD, comfortable in exam room RESP: Unlabored respirations, symmetric chest rise PSY: normal mood, congruent affect   MSK EXAM: Poor hamstring flexibility bilaterally.   Moderate TTP at myotendinous junction of bilateral hamstrings with resisted knee flexion while prone. Left hamstring pain increased with external rotation of tibia. Right hamstring pain increased with internal rotation of tibia. Significant weakness of hamstrings bilaterally. Significant weakness of hip abductors bilaterally, R weaker than L. Positive H test L>R.  Note flattening of lumbar spine on observation with tightness with extension Assessment & Plan Hamstring injury, right, initial encounter This is an acute on chronic issue, ongoing now for 6 months with limited improvement despite 2 months of PT/HEP. Related to his chronic lumbar spine disease.   Plan: -Activity modification reviewed. -Start to incorporate [place] Protocol for hamstring rehabilitation and hip abductor strengthening exercises. These were demonstrated during today's appointment. -5/16th inch heel wedges inserted into shoes bilaterally. Additional pair provided for his tennis shoes. Cont compression sleeves -F/u in 4-6 weeks. Hamstring injury, left, initial encounter This is an acute on chronic issue, ongoing now for 6 months with limited improvement despite 2 months of PT/HEP. Related to his chronic lumbar spine disease.   Plan: -Activity modification reviewed. -Start to incorporate [place] Protocol for hamstring rehabilitation and hip abductor strengthening exercises. These were demonstrated during today's appointment. -5/16th inch heel wedges inserted into shoes bilaterally. Additional pair provided for his tennis shoes. -F/u in  4-6 weeks. Degeneration of intervertebral disc of lumbar region with discogenic back pain and lower extremity pain Chronic issue for him, fairly well controlled on Gabapentin . Given the interplay of  neurogenic inhibition with his current hamstring injury recommend increasing back to his previous dosage.  -Refill of Gabapentin  with goal of 1800mg  daily. Patient planning to take 900mg  BID rather than previous 600mg  TID.   Prentice Niece, MD PGY-4, Sports Medicine Fellow [facility]  I observed and examined the patient with the [facility] resident and agree with assessment and plan.  Note reviewed and modified by me. PATRICE Haddock, MD

## 2023-10-13 NOTE — Patient Instructions (Signed)
 We discussed adding some additional exercises for your hamstring and supportive hip musculature.  These include the [place] protocol for the hamstring (glider, extender and diver)  Hip abductor exercises start with no additional weight, but once able to do sets of 15-20 without pain or difficulty - increase weight by 1-2 pounds of ankle weight.

## 2023-10-13 NOTE — Assessment & Plan Note (Signed)
 Chronic issue for him, fairly well controlled on Gabapentin . Given the interplay of neurogenic inhibition with his current hamstring injury recommend increasing back to his previous dosage.  -Refill of Gabapentin  with goal of 1800mg  daily. Patient planning to take 900mg  BID rather than previous 600mg  TID.

## 2023-12-06 ENCOUNTER — Ambulatory Visit: Payer: PPO | Admitting: Sports Medicine

## 2023-12-06 VITALS — BP 118/68 | Ht 67.0 in | Wt 140.0 lb

## 2023-12-06 DIAGNOSIS — M51361 Other intervertebral disc degeneration, lumbar region with lower extremity pain only: Secondary | ICD-10-CM | POA: Diagnosis not present

## 2023-12-06 DIAGNOSIS — S76319D Strain of muscle, fascia and tendon of the posterior muscle group at thigh level, unspecified thigh, subsequent encounter: Secondary | ICD-10-CM | POA: Diagnosis not present

## 2023-12-06 NOTE — Assessment & Plan Note (Signed)
 While the etiology of his hamstring strain is probably related to his lumbar degenerative disc disease I believe he has enough local hamstring symptoms to treat these  Continue with his home exercise program and add hamstring curls and swings  Uses heel lifts  ESWT Trial Location Medial proximal hamstring left and right Shocks: 1250 to each side Power level: 120 mJ Head size large Freq. 15  Tolerated this well  He is to see if the may have gives him significant relief If so we may want to give him 4-6 treatments to see if this improves his hamstring recovery

## 2023-12-06 NOTE — Assessment & Plan Note (Signed)
 He has tolerated the higher dose of gabapentin well and we will keep him on 1800 mg a day  I believe the neurogenic compression from his lumbar area is triggering the hamstring issues  We will continue to rehab his hamstring but also treat his radicular problems with gabapentin

## 2023-12-06 NOTE — Progress Notes (Signed)
 Follow-up bilateral hamstring pain  Patient had hamstring weakness hip weakness and bilateral hamstring pain when last seen.  This was thought to be triggered by his degenerative lumbar disc disease.  He has had significant improvement with increased dose of gabapentin along with hamstring exercise series.  He has been doing these daily and feels that his strength is better However he continues to have mild pain particularly in the medial aspect of both hamstrings with the left being worse than the right He does use a heel lift in his shoes  Physical exam Pleasant athletic male in no acute distress Examination shows full range of motion He now has excellent hip flexion and hip abduction strength bilaterally Hamstring testing reveals weakness on the right leg of the medial hamstrings on resistance Good strength with foot rotated outward on hamstring testing and with foot in a neutral position There is some mild tenderness to palpation over the semimembranosus 3 to 4 cm below the ischial tuberosity  Left hamstring shows some more generalized weakness with the foot internally externally rotated or in a neutral position There is also some mild tenderness to palpation over the semimembranosus 3 to 4 cm below the ischial tuberosity and perhaps some mild muscle spasm  H test bilaterally allows him to get hip flexion of about 70 degrees

## 2023-12-14 DIAGNOSIS — C44219 Basal cell carcinoma of skin of left ear and external auricular canal: Secondary | ICD-10-CM | POA: Diagnosis not present

## 2023-12-20 DIAGNOSIS — R350 Frequency of micturition: Secondary | ICD-10-CM | POA: Diagnosis not present

## 2023-12-20 DIAGNOSIS — C61 Malignant neoplasm of prostate: Secondary | ICD-10-CM | POA: Diagnosis not present

## 2023-12-27 ENCOUNTER — Ambulatory Visit: Payer: PPO | Admitting: Sports Medicine

## 2023-12-27 DIAGNOSIS — N401 Enlarged prostate with lower urinary tract symptoms: Secondary | ICD-10-CM | POA: Diagnosis not present

## 2023-12-27 DIAGNOSIS — R350 Frequency of micturition: Secondary | ICD-10-CM | POA: Diagnosis not present

## 2023-12-27 DIAGNOSIS — C61 Malignant neoplasm of prostate: Secondary | ICD-10-CM | POA: Diagnosis not present

## 2024-01-03 DIAGNOSIS — C4441 Basal cell carcinoma of skin of scalp and neck: Secondary | ICD-10-CM | POA: Diagnosis not present

## 2024-01-17 ENCOUNTER — Ambulatory Visit: Payer: PPO | Admitting: Sports Medicine

## 2024-01-17 ENCOUNTER — Encounter: Payer: Self-pay | Admitting: Sports Medicine

## 2024-01-17 VITALS — BP 120/60 | Ht 67.0 in | Wt 140.0 lb

## 2024-01-17 DIAGNOSIS — S8491XA Injury of unspecified nerve at lower leg level, right leg, initial encounter: Secondary | ICD-10-CM | POA: Diagnosis not present

## 2024-01-17 DIAGNOSIS — S76319D Strain of muscle, fascia and tendon of the posterior muscle group at thigh level, unspecified thigh, subsequent encounter: Secondary | ICD-10-CM

## 2024-01-17 DIAGNOSIS — M25561 Pain in right knee: Secondary | ICD-10-CM | POA: Diagnosis not present

## 2024-01-17 NOTE — Patient Instructions (Signed)
 Take vitamin B6 daily for the next month. You can get this over the counter.

## 2024-01-17 NOTE — Assessment & Plan Note (Addendum)
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 This is 2/2 a direct trauma from fall.  - There may have been some slight involvement of the common fibular nerve with trace weakness of dorsiflexion on exam but no true foot drop.  -We discussed the length of nerve healing and use of heat and strengthening for therapy -He will start vitamin B6 OTC x 1 month

## 2024-01-17 NOTE — Progress Notes (Signed)
 George Ramos - 68 y.o. male MRN [id number]  Date of birth: [birth year]  PCP: Farris Has, MD  Subjective:  No chief complaint on file.  Left hamstring strain and right knee pain  HPI: Past Medical, Surgical, Social, and Family History Reviewed & Updated per EMR.   Patient is a 68 y.o. male here for follow up on his left hamstring strain. The pain is gone and his strength has improved with very consistent strengthening exercises. He is doing these on average 6 days a week.  He had two falls during tennis matches while trying to protect his hamstrings resulting in a contusion over the right lateral knee. Since then the swelling has resolved and he no longer has pain but has had numbness over the lateral knee since then and some slight weakness in the right foot. He denies any other numbness or weakness, or current knee swelling. He has also been doing hip abductor exercises but has not been doing any quad specific strengthening.   Past Medical History:  Diagnosis Date   Arthritis    BPH (benign prostatic hypertrophy)    Cervical spondylosis    Incomplete emptying of bladder    Lumbar stenosis    Nocturia     Current Outpatient Medications on File Prior to Visit  Medication Sig Dispense Refill   gabapentin (NEURONTIN) 600 MG tablet Take 1 tablet (600 mg total) by mouth 3 (three) times daily. 180 tablet 2   Multiple Vitamins-Minerals (MENS 50+ MULTI VITAMIN/MIN PO) Take 1 tablet by mouth daily.     predniSONE (DELTASONE) 20 MG tablet Take 1 tablet (20 mg total) by mouth 2 (two) times daily. 14 tablet 0   No current facility-administered medications on file prior to visit.    Past Surgical History:  Procedure Laterality Date   colonoscopy     COLONOSCOPY WITH PROPOFOL N/A 09/16/2015   Procedure: COLONOSCOPY WITH PROPOFOL;  Surgeon: Charolett Bumpers, MD;  Location: [facility];  Service: Endoscopy;  Laterality: N/A;   GREEN LIGHT LASER TURP (TRANSURETHRAL RESECTION OF PROSTATE N/A  11/20/2013   Procedure: GREEN LIGHT LASER TURP (TRANSURETHRAL RESECTION OF PROSTATE;  Surgeon: Antony Haste, MD;  Location: [facility];  Service: Urology;  Laterality: N/A;   INGUINAL HERNIA REPAIR  AGE 34    No Known Allergies      Objective:  Physical Exam: VS: BP:120/60  HR: bpm  TEMP: ( )  RESP:   HT:5\' 7"  (170.2 cm)   WT:140 lb (63.5 kg)  BMI:21.92  Gen: NAD, speaks clearly, comfortable in exam room Respiratory: Normal respiratory effort on room air. No signs of distress Skin: No rashes, abrasions, or ecchymosis MSK:  Right Knee: Inspection: there is a healing abrasion over the fibular head but no edema or ecchymosis ROM: full Strength: 5/5 in flexion and extension Palpation w/ no tenderness over the fibular head  no joint line tenderness to palpation  no patellar tenderness, translation w/ some mild discomfort no condyle tenderness. Meniscus: Thessaly's negative Weighted 1 legged squat to 45 degrees elicits pain deep to the patella  Left Hamstring: Inspection: no defects ROM: full Strength: 5/5 with only trace weakness compared to the right No tenderness to palpation H test gives 80 degrees on the right and 75 degrees on the left. 80 degrees on the left elicits pain.  Neuro: Sensory deficit to light touch over the right inferior lateral knee, DTRs 2+ and equal at the patella    Assessment & Plan:   Neuropraxia  of right lower extremity - The patient's hx and exam are consistent with neuropraxia of the lateral inferior genicular nerve of the right knee. This is 2/2 a direct trauma from fall.  - There may have been some slight involvement of the common fibular nerve with trace weakness of dorsiflexion on exam but no true foot drop.  -We discussed the length of nerve healing and use of heat and strengthening for therapy -He will start vitamin B6 OTC x 1 month   Hamstring muscle strain - The patient has been very consistent with his  hamstring exercises.  He is increased his range of motion to 80 degrees on the right and 75 degrees on the left and only slight weakness on the left side w/ no more tenderness.  -At this point we will have him decrease to 5 days a week with the addition of quadriceps exercises as below -We discussed a level of chronic hamstring tightness that is likely related to his lumbar issues as well.  Right knee pain - Oscar is having right greater than left anterior knee pain that is likely related to his very consistent strengthening of the hamstrings.  There is now a small imbalance with the quadriceps muscles. - He will decrease his hamstring exercises as above and start twice weekly quadricep strengthening exercises or biking. - This along with his hip abductor strengthening will give him some good support to continue tennis    Rica Mote MD [facility] Sports Medicine Fellow  I observed and examined the patient with the [facility] resident and agree with assessment and plan.  Note reviewed and modified by me. Vedia Coffer

## 2024-01-17 NOTE — Assessment & Plan Note (Addendum)
-   George Ramos is having right greater than left anterior knee pain that is likely related to his very consistent strengthening of the hamstrings.  There is now a small imbalance with the quadriceps muscles. - He will decrease his hamstring exercises as above and start twice weekly quadricep strengthening exercises or biking. - This along with his hip abductor strengthening will give him some good support to continue tennis

## 2024-01-17 NOTE — Assessment & Plan Note (Addendum)
-   The patient has been very consistent with his hamstring exercises.  He is increased his range of motion to 80 degrees on the right and 75 degrees on the left and only slight weakness on the left side w/ no more tenderness.  -At this point we will have him decrease to 5 days a week with the addition of quadriceps exercises as below -We discussed a level of chronic hamstring tightness that is likely related to his lumbar issues as well.

## 2024-03-20 DIAGNOSIS — C61 Malignant neoplasm of prostate: Secondary | ICD-10-CM | POA: Diagnosis not present

## 2024-03-27 DIAGNOSIS — N5201 Erectile dysfunction due to arterial insufficiency: Secondary | ICD-10-CM | POA: Diagnosis not present

## 2024-03-27 DIAGNOSIS — C61 Malignant neoplasm of prostate: Secondary | ICD-10-CM | POA: Diagnosis not present

## 2024-04-04 ENCOUNTER — Other Ambulatory Visit: Payer: Self-pay | Admitting: Sports Medicine

## 2024-04-09 ENCOUNTER — Other Ambulatory Visit: Payer: Self-pay

## 2024-04-09 MED ORDER — GABAPENTIN 600 MG PO TABS: 600.0000 mg | ORAL_TABLET | Freq: Three times a day (TID) | ORAL | 2 refills | Status: DC

## 2024-06-22 DIAGNOSIS — R001 Bradycardia, unspecified: Secondary | ICD-10-CM | POA: Diagnosis not present

## 2024-06-22 DIAGNOSIS — E785 Hyperlipidemia, unspecified: Secondary | ICD-10-CM | POA: Diagnosis not present

## 2024-06-22 DIAGNOSIS — C61 Malignant neoplasm of prostate: Secondary | ICD-10-CM | POA: Diagnosis not present

## 2024-06-22 DIAGNOSIS — Z0000 Encounter for general adult medical examination without abnormal findings: Secondary | ICD-10-CM | POA: Diagnosis not present

## 2024-06-22 DIAGNOSIS — Z23 Encounter for immunization: Secondary | ICD-10-CM | POA: Diagnosis not present

## 2024-07-25 ENCOUNTER — Ambulatory Visit: Admitting: Orthopaedic Surgery

## 2024-07-25 DIAGNOSIS — M5412 Radiculopathy, cervical region: Secondary | ICD-10-CM | POA: Diagnosis not present

## 2024-07-25 MED ORDER — METHYLPREDNISOLONE 4 MG PO TBPK: ORAL_TABLET | ORAL | 1 refills | Status: AC

## 2024-07-25 NOTE — Progress Notes (Signed)
 Office Visit Note   Patient: George Ramos           Date of Birth: 06-16-1956           MRN: [id number] Visit Date: 07/25/2024              Requested by: Kip Righter, MD [address] [place],  [place] [address] PCP: Kip Righter, MD   Assessment & Plan: Visit Diagnoses:  1. Radiculopathy, cervical region     Plan: History of Present Illness George Ramos is a 68 year old male who presents with right arm numbness and shoulder pain.  Right arm numbness and shoulder pain began last Friday or 08/11/24. Numbness is most pronounced in the pinky and ring fingers, with less intensity in other fingers. Shoulder pain worsens with use of the right arm. Symptoms were exacerbated after playing tennis, especially during serving and looking up. By evening, discomfort increased significantly, affecting his ability to perform tasks such as closing windows.  Denies any red flag symptoms such as incontinence, weakness, trauma.  He is reporting numbness mainly to the ring and little finger.  Physical Exam NECK: Neck pain on right rotation. Positive Spurling sign to the right side.   NEUROLOGICAL: Reflexes normal and symmetric. Upper extremity strength normal.  Decreased sensation to the C8 distribution.    Assessment and Plan Right C8-T1 cervical radiculopathy Right C8-T1 radiculopathy with numbness in right hand and shoulder pain.  Advised against chiropractic manipulation. Medrol  Dosepak may reduce symptoms. Emphasized monitoring for motor deficits or worsening symptoms. - Prescribe Medrol  Dosepak. - Advise against overhead activities; keep activities at eye level or below. - Consider physical therapy for neck post-tournament. - Avoid chiropractic neck manipulation. - Monitor symptoms; seek urgent care if motor deficits develop or symptoms worsen.  Follow-Up Instructions: No follow-ups on file.   Orders:  No orders of the defined types were placed in this  encounter.  Meds ordered this encounter  Medications   methylPREDNISolone  (MEDROL  DOSEPAK) 4 MG TBPK tablet    Sig: Take as directed    Dispense:  21 tablet    Refill:  1      Procedures: No procedures performed   Clinical Data: No additional findings.   Subjective: Chief Complaint  Patient presents with   Right Shoulder - Pain    HPI  Review of Systems  Constitutional: Negative.   HENT: Negative.    Eyes: Negative.   Respiratory: Negative.    Cardiovascular: Negative.   Gastrointestinal: Negative.   Endocrine: Negative.   Genitourinary: Negative.   Skin: Negative.   Allergic/Immunologic: Negative.   Neurological: Negative.   Hematological: Negative.   Psychiatric/Behavioral: Negative.    All other systems reviewed and are negative.    Objective: Vital Signs: There were no vitals taken for this visit.  Physical Exam Vitals and nursing note reviewed.  Constitutional:      Appearance: He is well-developed.  HENT:     Head: Normocephalic and atraumatic.  Eyes:     Pupils: Pupils are equal, round, and reactive to light.  Pulmonary:     Effort: Pulmonary effort is normal.  Abdominal:     Palpations: Abdomen is soft.  Musculoskeletal:        General: Normal range of motion.     Cervical back: Neck supple.  Skin:    General: Skin is warm.  Neurological:     Mental Status: He is alert and oriented to person, place, and time.  Psychiatric:  Behavior: Behavior normal.        Thought Content: Thought content normal.        Judgment: Judgment normal.     Ortho Exam  Specialty Comments:  No specialty comments available.  Imaging: No results found.   PMFS History: Patient Active Problem List   Diagnosis Date Noted   Radiculopathy, cervical region 07/25/2024   Neuropraxia of right lower extremity 01/17/2024   Hand pain, right 03/23/2022   Weight loss 11/12/2021   Right knee pain 11/03/2017   Medial epicondylitis of elbow, left  01/06/2017   Left elbow pain 07/28/2016   Left wrist pain 05/20/2016   Bleeding 09/20/2013   Lateral epicondylitis of right elbow 12/19/2012   Abnormal gait 07/13/2012   Right foot pain 03/28/2012   Cervical spine degeneration 10/27/2011   Hamstring muscle strain 05/13/2011   OTHER TEAR CARTILAGE OR MENISCUS KNEE CURRENT 03/19/2009   SHOULDER PAIN, RIGHT, CHRONIC 10/29/2008   Pain in joint, lower leg 10/29/2008   Sprain of tibiofibular ligament of ankle 01/20/2007   Degenerative disc disease, lumbar 12/16/2006   Backache 12/16/2006   Past Medical History:  Diagnosis Date   Arthritis    BPH (benign prostatic hypertrophy)    Cervical spondylosis    Incomplete emptying of bladder    Lumbar stenosis    Nocturia     Family History  Problem Relation Age of Onset   Cancer Father 71       colon ca   Cancer Sister 67       colon ca    Past Surgical History:  Procedure Laterality Date   colonoscopy     COLONOSCOPY WITH PROPOFOL  N/A 09/16/2015   Procedure: COLONOSCOPY WITH PROPOFOL ;  Surgeon: Gladis MARLA Louder, MD;  Location: [facility];  Service: Endoscopy;  Laterality: N/A;   GREEN LIGHT LASER TURP (TRANSURETHRAL RESECTION OF PROSTATE N/A 11/20/2013   Procedure: GREEN LIGHT LASER TURP (TRANSURETHRAL RESECTION OF PROSTATE;  Surgeon: Donnice Gwenyth Brooks, MD;  Location: [facility];  Service: Urology;  Laterality: N/A;   INGUINAL HERNIA REPAIR  AGE 68   Social History   Occupational History   Not on file  Tobacco Use   Smoking status: Never   Smokeless tobacco: Never  Substance and Sexual Activity   Alcohol use: No   Drug use: No   Sexual activity: Not on file

## 2024-07-27 ENCOUNTER — Ambulatory Visit (INDEPENDENT_AMBULATORY_CARE_PROVIDER_SITE_OTHER): Admitting: Orthopaedic Surgery

## 2024-07-27 DIAGNOSIS — M5412 Radiculopathy, cervical region: Secondary | ICD-10-CM | POA: Diagnosis not present

## 2024-07-27 MED ORDER — PREDNISONE 10 MG (21) PO TBPK: ORAL_TABLET | ORAL | 3 refills | Status: AC

## 2024-07-27 NOTE — Progress Notes (Signed)
 Injected with 60mg  Toradol  in left buttock.

## 2024-08-06 ENCOUNTER — Other Ambulatory Visit: Payer: Self-pay

## 2024-08-06 ENCOUNTER — Ambulatory Visit: Admitting: Orthopaedic Surgery

## 2024-08-06 DIAGNOSIS — M5412 Radiculopathy, cervical region: Secondary | ICD-10-CM | POA: Diagnosis not present

## 2024-08-06 NOTE — Progress Notes (Signed)
 Office Visit Note   Patient: George Ramos           Date of Birth: [birth year]           MRN: [id number] Visit Date: 08/06/2024              Requested by: George Righter, MD [address] [place],  [place] [address] PCP: George Righter, MD   Assessment & Plan: Visit Diagnoses:  1. Radiculopathy, cervical region     Plan: Impression is cervical radiculopathy with temporary relief from steroid dose packs.  Recommend PT and NSAIDs.  Discussed possibility of MRI and/or ESI if symptoms do not improve.  Currently, main symptom is numbness with fluctuating pain.  Already on 1800 mg gabapentin  daily for lumbar spine.    Follow-Up Instructions: No follow-ups on file.   Orders:  Orders Placed This Encounter  Procedures   XR Cervical Spine 2 or 3 views   Ambulatory referral to Physical Therapy   No orders of the defined types were placed in this encounter.     Procedures: No procedures performed   Clinical Data: No additional findings.   Subjective: Chief Complaint  Patient presents with   Neck - Pain    HPI George Ramos returns today for ongoing radicular symptoms into RUE.  Felt temporary relief medrol  dose pak followed by prednisone  dose pack and IM toradol  injection.  Symptoms returned as dose pack tapered down.  Reports some posterior shoulder pain as well.  Denies any new weakness.  Continues to have numbness into the RF and SF.  Review of Systems  Constitutional: Negative.   HENT: Negative.    Eyes: Negative.   Respiratory: Negative.    Cardiovascular: Negative.   Gastrointestinal: Negative.   Endocrine: Negative.   Genitourinary: Negative.   Skin: Negative.   Allergic/Immunologic: Negative.   Neurological: Negative.   Hematological: Negative.   Psychiatric/Behavioral: Negative.    All other systems reviewed and are negative.    Objective: Vital Signs: There were no vitals taken for this visit.  Physical Exam Vitals and nursing note  reviewed.  Constitutional:      Appearance: He is well-developed.  HENT:     Head: Normocephalic and atraumatic.  Eyes:     Pupils: Pupils are equal, round, and reactive to light.  Pulmonary:     Effort: Pulmonary effort is normal.  Abdominal:     Palpations: Abdomen is soft.  Musculoskeletal:        General: Normal range of motion.     Cervical back: Neck supple.  Skin:    General: Skin is warm.  Neurological:     Mental Status: He is alert and oriented to person, place, and time.  Psychiatric:        Behavior: Behavior normal.        Thought Content: Thought content normal.        Judgment: Judgment normal.     Ortho Exam Exam of right shoulder - normal ROM and strength to MMT - pain in the posterior aspect of shoulder  Exam of cervical spine - no spasticity - normal strength C5-T1 - numbness in SF and RF  Specialty Comments:  No specialty comments available.  Imaging: XR Cervical Spine 2 or 3 views Result Date: 08/06/2024 Xrays show C5-6 disc space narrowing and degenerative spurring.    PMFS History: Patient Active Problem List   Diagnosis Date Noted   Radiculopathy, cervical region 07/25/2024   Neuropraxia of right lower extremity  01/17/2024   Hand pain, right 03/23/2022   Weight loss 11/12/2021   Right knee pain 11/03/2017   Medial epicondylitis of elbow, left 01/06/2017   Left elbow pain 07/28/2016   Left wrist pain 05/20/2016   Bleeding 09/20/2013   Lateral epicondylitis of right elbow 12/19/2012   Abnormal gait 07/13/2012   Right foot pain 03/28/2012   Cervical spine degeneration 10/27/2011   Hamstring muscle strain 05/13/2011   OTHER TEAR CARTILAGE OR MENISCUS KNEE CURRENT 03/19/2009   SHOULDER PAIN, RIGHT, CHRONIC 10/29/2008   Pain in joint, lower leg 10/29/2008   Sprain of tibiofibular ligament of ankle 01/20/2007   Degenerative disc disease, lumbar 12/16/2006   Backache 12/16/2006   Past Medical History:  Diagnosis Date   Arthritis     BPH (benign prostatic hypertrophy)    Cervical spondylosis    Incomplete emptying of bladder    Lumbar stenosis    Nocturia     Family History  Problem Relation Age of Onset   Cancer Father 75       colon ca   Cancer Sister 64       colon ca    Past Surgical History:  Procedure Laterality Date   colonoscopy     COLONOSCOPY WITH PROPOFOL  N/A 09/16/2015   Procedure: COLONOSCOPY WITH PROPOFOL ;  Surgeon: George MARLA Louder, MD;  Location: [facility];  Service: Endoscopy;  Laterality: N/A;   GREEN LIGHT LASER TURP (TRANSURETHRAL RESECTION OF PROSTATE N/A 11/20/2013   Procedure: GREEN LIGHT LASER TURP (TRANSURETHRAL RESECTION OF PROSTATE;  Surgeon: George Gwenyth Brooks, MD;  Location: [facility];  Service: Urology;  Laterality: N/A;   INGUINAL HERNIA REPAIR  AGE 30   Social History   Occupational History   Not on file  Tobacco Use   Smoking status: Never   Smokeless tobacco: Never  Substance and Sexual Activity   Alcohol use: No   Drug use: No   Sexual activity: Not on file

## 2024-08-08 ENCOUNTER — Encounter: Payer: Self-pay | Admitting: Physical Therapy

## 2024-08-08 ENCOUNTER — Other Ambulatory Visit: Payer: Self-pay

## 2024-08-08 ENCOUNTER — Ambulatory Visit: Attending: Orthopaedic Surgery | Admitting: Physical Therapy

## 2024-08-08 DIAGNOSIS — M5412 Radiculopathy, cervical region: Secondary | ICD-10-CM | POA: Insufficient documentation

## 2024-08-08 DIAGNOSIS — R293 Abnormal posture: Secondary | ICD-10-CM | POA: Diagnosis not present

## 2024-08-08 DIAGNOSIS — M6281 Muscle weakness (generalized): Secondary | ICD-10-CM | POA: Insufficient documentation

## 2024-08-08 NOTE — Therapy (Signed)
 OUTPATIENT PHYSICAL THERAPY CERVICAL EVALUATION   Patient Name: George Ramos MRN: [id number] DOB:23-Jan-1956, 68 y.o., male Today's Date: 08/08/2024  END OF SESSION:  PT End of Session - 08/08/24 1642     Visit Number 1    Date for Recertification  10/03/24    Authorization Type HTA (no auth req)    PT Start Time 1534    PT Stop Time 1617    PT Time Calculation (min) 43 min    Activity Tolerance Patient tolerated treatment well    Behavior During Therapy WFL for tasks assessed/performed          Past Medical History:  Diagnosis Date   Arthritis    BPH (benign prostatic hypertrophy)    Cervical spondylosis    Incomplete emptying of bladder    Lumbar stenosis    Nocturia    Past Surgical History:  Procedure Laterality Date   colonoscopy     COLONOSCOPY WITH PROPOFOL  N/A 09/16/2015   Procedure: COLONOSCOPY WITH PROPOFOL ;  Surgeon: Gladis MARLA Louder, MD;  Location: [facility];  Service: Endoscopy;  Laterality: N/A;   GREEN LIGHT LASER TURP (TRANSURETHRAL RESECTION OF PROSTATE N/A 11/20/2013   Procedure: GREEN LIGHT LASER TURP (TRANSURETHRAL RESECTION OF PROSTATE;  Surgeon: Donnice Gwenyth Brooks, MD;  Location: [facility];  Service: Urology;  Laterality: N/A;   INGUINAL HERNIA REPAIR  AGE 64   Patient Active Problem List   Diagnosis Date Noted   Radiculopathy, cervical region 07/25/2024   Neuropraxia of right lower extremity 01/17/2024   Hand pain, right 03/23/2022   Weight loss 11/12/2021   Right knee pain 11/03/2017   Medial epicondylitis of elbow, left 01/06/2017   Left elbow pain 07/28/2016   Left wrist pain 05/20/2016   Bleeding 09/20/2013   Lateral epicondylitis of right elbow 12/19/2012   Abnormal gait 07/13/2012   Right foot pain 03/28/2012   Cervical spine degeneration 10/27/2011   Hamstring muscle strain 05/13/2011   OTHER TEAR CARTILAGE OR MENISCUS KNEE CURRENT 03/19/2009   SHOULDER PAIN, RIGHT, CHRONIC 10/29/2008   Pain in joint,  lower leg 10/29/2008   Sprain of tibiofibular ligament of ankle 01/20/2007   Degenerative disc disease, lumbar 12/16/2006   Backache 12/16/2006    PCP: Kip Righter, MD   REFERRING PROVIDER: Jerri Kay HERO, MD  REFERRING DIAG: [phone] (ICD-10-CM) - Radiculopathy, cervical region  THERAPY DIAG:  Radiculopathy, cervical region  Abnormal posture  Muscle weakness (generalized)  Rationale for Evaluation and Treatment: Rehabilitation  ONSET DATE: 3 weeks ago  SUBJECTIVE:  SUBJECTIVE STATEMENT: Patient presents with numbness in his right hand digits 3,4,5. The numbness traveled up his right arm into his shoulder. His MD put him on prednisone  and ibuprofen and he had noted minimal relief. He has noted mild changes in his grip strength and over head strength. His tingling increases when he reaches overhead  Hand dominance: Right  PERTINENT HISTORY:  OA; DDD; patient reports having a fracture of his cervical vertebra   PAIN:  Are you having pain? Yes: NPRS scale: 3(currently) 8(worst)/10 Pain location: right hand into the shoulder Pain description: dull; numbness is constant Aggravating factors: reaching over head, playing tennis (instant pain) ; washing his hair; putting on shirt Relieving factors: relaxing his arm down  PRECAUTIONS: None  RED FLAGS: None     WEIGHT BEARING RESTRICTIONS: No  FALLS:  Has patient fallen in last 6 months? No  LIVING ENVIRONMENT: Lives with: lives with their family Lives in: House/apartment Stairs: Yes: Internal: 14 steps; on left going up and External: 8 steps; can reach both  OCCUPATION: Retired but about to start renovating a house   PLOF: Independent, Independent with basic ADLs, Independent with gait, Independent with transfers, and  Leisure: play tennis & play music (mandolin)   PATIENT GOALS: To get rid of the problem  NEXT MD VISIT: Next week  OBJECTIVE:  Note: Objective measures were completed at Evaluation unless otherwise noted.  DIAGNOSTIC FINDINGS:  Xrays show C5-6 disc space narrowing and degenerative spurring   PATIENT SURVEYS:  Quick Dash 59.1/100  COGNITION: Overall cognitive status: Within functional limits for tasks assessed  SENSATION: Constant numbness in right hand   POSTURE: rounded shoulders and forward head  PALPATION: Increased muscle spasms right upper trap Decreased Grade II joint mobility throughout C-spine   CERVICAL ROM:   Active ROM A/PROM (deg) eval  Flexion 35  Extension 50  Right lateral flexion 20  Left lateral flexion 32  Right rotation 60  Left rotation 65   (Blank rows = not tested)  UPPER EXTREMITY ROM: WFL bilateral    UPPER EXTREMITY MMT: 5/5 bilateral increase in tingling in right digits (3,4,5 with testing)  MMT Right eval Left eval  Shoulder flexion    Shoulder extension    Shoulder abduction    Shoulder adduction    Shoulder extension    Shoulder internal rotation    Shoulder external rotation    Middle trapezius    Lower trapezius    Elbow flexion    Elbow extension    Wrist flexion    Wrist extension    Wrist ulnar deviation    Wrist radial deviation    Wrist pronation    Wrist supination    Grip strength 70 72   (Blank rows = not tested)  CERVICAL SPECIAL TESTS:  Distraction test: No change in tingling   FUNCTIONAL TESTS:  5 times sit to stand: 9.65 sec no UE support Timed up and go (TUG): 6.60 sec  TREATMENT DATE: 08/08/2024 Initial Evaluation & HEP created  Benefits of heat/ice Balance of working on neutral mobility while trying not to aggravate inflammation   PATIENT EDUCATION:  Education details:  PT eval findings, anticipated POC, progress with PT, and initial HEP Person educated: Patient Education method: Explanation, Demonstration, and Handouts Education comprehension: verbalized understanding, returned demonstration, and needs further education  HOME EXERCISE PROGRAM: Access Code: [email or web address] URL: [email or web address] Date: 08/08/2024 Prepared by: Kristeen Sar  Exercises - Ulnar Nerve Glide- Full Arm  - 1 x daily - 7 x weekly - 1 sets - 10 reps - Shoulder External Rotation and Scapular Retraction with Resistance  - 1 x daily - 7 x weekly - 1 sets - 10 reps - Seated Upper Trapezius Stretch  - 1 x daily - 7 x weekly - 1-2 sets - 15-20s hold  ASSESSMENT:  CLINICAL IMPRESSION: Patient is a 67 y.o. male who was seen today for physical therapy evaluation and treatment for cervical radiculopathy. Rockne presents with numbness in his 3rd,4th, and 5th digits that began 3 weeks ago. Educated patient using the spinal model of Xray findings and how this is contributing to his symptoms. His pain is interfering with his ability to perform movements overhead (washing his hair and putting on a shirt), playing tennis, and playing the mandolin. Patient verbalized understanding. Based on evaluation noted decreased ROM, tingling in hands, and poor posture. Educated patient on the potential benefit for traction and how it work. He is highly motivated and open to trying this. Patient will benefit from skilled PT to address the below impairments and improve overall function.   OBJECTIVE IMPAIRMENTS: decreased ROM, increased muscle spasms, impaired sensation, impaired UE functional use, postural dysfunction, and pain.   ACTIVITY LIMITATIONS: carrying, lifting, bathing, dressing, reach over head, and hygiene/grooming  PARTICIPATION LIMITATIONS: cleaning, laundry, driving, community activity, and playing tennis & the mandolin  PERSONAL FACTORS: Age, Fitness, Time since onset of  injury/illness/exacerbation, and 1-2 comorbidities: OA and DDD are also affecting patient's functional outcome.   REHAB POTENTIAL: Good  CLINICAL DECISION MAKING: Evolving/moderate complexity  EVALUATION COMPLEXITY: Moderate   GOALS: Goals reviewed with patient? Yes  SHORT TERM GOALS: Target date: 09/05/2024  Patient will be independent with initial HEP. Baseline:  Goal status: INITIAL  2.  Patient will report > or = to 30% improvement in functional use of right UE since starting PT. Baseline:  Goal status: INITIAL   LONG TERM GOALS: Target date: 10/03/2024  Patient will demonstrate independence in advanced HEP. Baseline:  Goal status: INITIAL  2.  Patient will report > or = to 50% improvement in right hand numbness since starting PT. Baseline:  Goal status: INITIAL  3.  Patient will be able to play tennis with no difficulty to maintain cardiovascular health. Baseline: unable Goal status: INITIAL   4.  Patient will be able to play the mandolin with minimal difficulty due to finger numbness.  Baseline:  Goal status: INITIAL  5.  Patient will score > or = to 70/100 on QUICKDASH due to improved UE use and decreased disability.  Baseline:  Goal status: INITIAL    PLAN:  PT FREQUENCY: 2x/week  PT DURATION: 8 weeks  PLANNED INTERVENTIONS: 97164- PT Re-evaluation, 97110-Therapeutic exercises, 97530- Therapeutic activity, 97112- Neuromuscular re-education, 97535- Self Care, 02859- Manual therapy, [phone]- Canalith repositioning, [id number]- Aquatic Therapy, H9716- Electrical stimulation (unattended), [phone]- Electrical stimulation (manual), [id number]- Vasopneumatic device, [id number]- Ultrasound, [id number]- Traction (mechanical), [id number]- Ionotophoresis 4mg /ml Dexamethasone , 79439 (1-2 muscles), 20561 (3+ muscles)- Dry Needling, Patient/Family education, Balance training, Joint mobilization, Joint manipulation, Spinal  manipulation, Spinal mobilization, Vestibular training, Cryotherapy, and  Moist heat  PLAN FOR NEXT SESSION: Review HEP; shoulder strengthening; trial of cervical traction; manual as indicated    Kristeen Sar, PT 08/08/24 4:43 PM [facility] [address] [place], [place] [address] Phone # [phone] Fax [phone]

## 2024-08-13 ENCOUNTER — Encounter: Payer: Self-pay | Admitting: Radiology

## 2024-08-14 ENCOUNTER — Other Ambulatory Visit: Payer: Self-pay

## 2024-08-14 ENCOUNTER — Ambulatory Visit: Admitting: Orthopaedic Surgery

## 2024-08-14 ENCOUNTER — Ambulatory Visit (INDEPENDENT_AMBULATORY_CARE_PROVIDER_SITE_OTHER): Payer: Self-pay

## 2024-08-14 ENCOUNTER — Ambulatory Visit: Admitting: Sports Medicine

## 2024-08-14 ENCOUNTER — Encounter: Payer: Self-pay | Admitting: Sports Medicine

## 2024-08-14 DIAGNOSIS — M25511 Pain in right shoulder: Secondary | ICD-10-CM

## 2024-08-14 DIAGNOSIS — G8929 Other chronic pain: Secondary | ICD-10-CM

## 2024-08-14 MED ORDER — LIDOCAINE HCL 1 % IJ SOLN
2.0000 mL | INTRAMUSCULAR | Status: AC | PRN
  Administered 2024-08-14: 2 mL

## 2024-08-14 MED ORDER — BUPIVACAINE HCL 0.25 % IJ SOLN
2.0000 mL | INTRAMUSCULAR | Status: AC | PRN
  Administered 2024-08-14: 2 mL via INTRA_ARTICULAR

## 2024-08-14 MED ORDER — METHYLPREDNISOLONE ACETATE 40 MG/ML IJ SUSP
80.0000 mg | INTRAMUSCULAR | Status: AC | PRN
  Administered 2024-08-14: 80 mg via INTRA_ARTICULAR

## 2024-08-14 NOTE — Progress Notes (Signed)
 Office Visit Note   Patient: George Ramos           Date of Birth: 01-10-1956           MRN: [id number] Visit Date: 08/14/2024              Requested by: George Righter, MD [address] [place],  [place] [address] PCP: George Righter, MD   Assessment & Plan: Visit Diagnoses:  1. Chronic right shoulder pain     Plan: History of Present Illness George Ramos is a 68 year old male who presents with persistent shoulder pain and tingling in the fingers.  The shoulder pain is dull and radiates from the fingers to the shoulder, with no neck pain. Symptoms have persisted without improvement, despite using ibuprofen and Aleve. Pain and numbness interfere with activities like washing hair, especially when raising his hand above his head. The pain is localized to the shoulder, not the arms. He has not played tennis for two weeks and is concerned about resuming the activity. He has been icing his neck despite the absence of neck pain.  Physical Exam MUSCULOSKELETAL: Mild discomfort on shoulder movement.   He has pain with impingement signs and pain with manual muscle testing of the supraspinatus.    Results RADIOLOGY Shoulder X-ray: Arthritic changes in the acromioclavicular [facility]) joint and glenohumeral joint, with a small osteophyte noted.  Assessment and Plan Right shoulder pain with glenohumeral and acromioclavicular joint osteoarthritis Chronic right shoulder pain due to glenohumeral and acromioclavicular joint osteoarthritis. Symptoms suggest shoulder pathology over cervical radiculopathy. X-rays confirm mild AC joint arthritis and a glenohumeral bone spur. Current NSAIDs ineffective. - Referred to Dr. Burnetta for ultrasound-guided cortisone injection in the right shoulder. - Advised rest from tennis for one to two weeks post-injection. - Instructed to monitor symptoms for one to two weeks post-injection to assess response.  Follow-Up Instructions: No follow-ups on  file.   Orders:  Orders Placed This Encounter  Procedures   XR Shoulder Right   No orders of the defined types were placed in this encounter.     Subjective: Chief Complaint  Patient presents with   Right Shoulder - Pain     Past Medical History:  Diagnosis Date   Arthritis    BPH (benign prostatic hypertrophy)    Cervical spondylosis    Incomplete emptying of bladder    Lumbar stenosis    Nocturia     Family History  Problem Relation Age of Onset   Cancer Father 74       colon ca   Cancer Sister 15       colon ca    Past Surgical History:  Procedure Laterality Date   colonoscopy     COLONOSCOPY WITH PROPOFOL  N/A 09/16/2015   Procedure: COLONOSCOPY WITH PROPOFOL ;  Surgeon: George MARLA Louder, MD;  Location: [facility];  Service: Endoscopy;  Laterality: N/A;   GREEN LIGHT LASER TURP (TRANSURETHRAL RESECTION OF PROSTATE N/A 11/20/2013   Procedure: GREEN LIGHT LASER TURP (TRANSURETHRAL RESECTION OF PROSTATE;  Surgeon: George Gwenyth Brooks, MD;  Location: [facility];  Service: Urology;  Laterality: N/A;   INGUINAL HERNIA REPAIR  AGE 64   Social History   Occupational History   Not on file  Tobacco Use   Smoking status: Never   Smokeless tobacco: Never  Substance and Sexual Activity   Alcohol use: No   Drug use: No   Sexual activity: Not on file

## 2024-08-14 NOTE — Progress Notes (Signed)
   Procedure Note  Patient: George Ramos             Date of Birth: 1955/10/19           MRN: [id number]             Visit Date: 08/14/2024  Procedures: Visit Diagnoses:  1. Chronic right shoulder pain    Large Joint Inj: R glenohumeral on 08/14/2024 9:00 AM Indications: pain Details: 22 G 3.5 in needle, ultrasound-guided posterior approach Medications: 2 mL lidocaine  1 %; 2 mL bupivacaine 0.25 %; 80 mg methylPREDNISolone  acetate 40 MG/ML Outcome: tolerated well, no immediate complications  US -guided glenohumeral joint injection, right shoulder After discussion on risks/benefits/indications, informed verbal consent was obtained. A timeout was then performed. The patient was positioned lying lateral recumbent on examination table. The patient's shoulder was prepped with betadine and multiple alcohol swabs and utilizing ultrasound guidance, the patient's glenohumeral joint was identified on ultrasound. Using ultrasound guidance a 22-gauge, 3.5 inch needle with a mixture of 2:2:2 cc's lidocaine :bupivicaine:depomedrol was directed from a lateral to medial direction via in-plane technique into the glenohumeral joint with visualization of appropriate spread of injectate into the joint. Patient tolerated the procedure well without immediate complications.      Procedure, treatment alternatives, risks and benefits explained, specific risks discussed. Consent was given by the patient. Immediately prior to procedure a time out was called to verify the correct patient, procedure, equipment, support staff and site/side marked as required. Patient was prepped and draped in the usual sterile fashion.     - patient tolerated procedure well, discussed post-injection protocol - follow-up with Dr. Jerri as indicated; I am happy to see them as needed - discussed with him allowing 2-4 weeks to see response, will see Dr. Jerri at that time or call/notify  Lonell Sprang, DO Primary Care Sports Medicine Physician   [facility] - Orthopedics  This note was dictated using Dragon naturally speaking software and may contain errors in syntax, spelling, or content which have not been identified prior to signing this note.

## 2024-08-15 ENCOUNTER — Ambulatory Visit

## 2024-08-17 ENCOUNTER — Ambulatory Visit

## 2024-08-21 ENCOUNTER — Ambulatory Visit: Attending: Orthopaedic Surgery | Admitting: Physical Therapy

## 2024-08-21 ENCOUNTER — Encounter: Payer: Self-pay | Admitting: Physical Therapy

## 2024-08-21 DIAGNOSIS — R293 Abnormal posture: Secondary | ICD-10-CM | POA: Insufficient documentation

## 2024-08-21 DIAGNOSIS — M6281 Muscle weakness (generalized): Secondary | ICD-10-CM | POA: Insufficient documentation

## 2024-08-21 DIAGNOSIS — M5412 Radiculopathy, cervical region: Secondary | ICD-10-CM | POA: Insufficient documentation

## 2024-08-21 NOTE — Therapy (Signed)
 OUTPATIENT PHYSICAL THERAPY CERVICAL TREATMENT/ DISCHARGE NOTE   Patient Name: George Ramos MRN: [id number] DOB:Jan 26, 1956, 68 y.o., male Today's Date: 08/21/2024  END OF SESSION:  PT End of Session - 08/21/24 0907     Visit Number 2    Date for Recertification  10/03/24    Authorization Type HTA (no auth req)    PT Start Time 0803    PT Stop Time 0851    PT Time Calculation (min) 48 min    Activity Tolerance Patient tolerated treatment well    Behavior During Therapy [facility] for tasks assessed/performed           Past Medical History:  Diagnosis Date   Arthritis    BPH (benign prostatic hypertrophy)    Cervical spondylosis    Incomplete emptying of bladder    Lumbar stenosis    Nocturia    Past Surgical History:  Procedure Laterality Date   colonoscopy     COLONOSCOPY WITH PROPOFOL  N/A 09/16/2015   Procedure: COLONOSCOPY WITH PROPOFOL ;  Surgeon: Gladis MARLA Louder, MD;  Location: [facility];  Service: Endoscopy;  Laterality: N/A;   GREEN LIGHT LASER TURP (TRANSURETHRAL RESECTION OF PROSTATE N/A 11/20/2013   Procedure: GREEN LIGHT LASER TURP (TRANSURETHRAL RESECTION OF PROSTATE;  Surgeon: Donnice Gwenyth Brooks, MD;  Location: [facility];  Service: Urology;  Laterality: N/A;   INGUINAL HERNIA REPAIR  AGE 63   Patient Active Problem List   Diagnosis Date Noted   Radiculopathy, cervical region 07/25/2024   Neuropraxia of right lower extremity 01/17/2024   Hand pain, right 03/23/2022   Weight loss 11/12/2021   Right knee pain 11/03/2017   Medial epicondylitis of elbow, left 01/06/2017   Left elbow pain 07/28/2016   Left wrist pain 05/20/2016   Bleeding 09/20/2013   Lateral epicondylitis of right elbow 12/19/2012   Abnormal gait 07/13/2012   Right foot pain 03/28/2012   Cervical spine degeneration 10/27/2011   Hamstring muscle strain 05/13/2011   OTHER TEAR CARTILAGE OR MENISCUS KNEE CURRENT 03/19/2009   SHOULDER PAIN, RIGHT, CHRONIC 10/29/2008    Pain in joint, lower leg 10/29/2008   Sprain of tibiofibular ligament of ankle 01/20/2007   Degenerative disc disease, lumbar 12/16/2006   Backache 12/16/2006    PCP: Kip Righter, MD   REFERRING PROVIDER: Jerri Kay HERO, MD  REFERRING DIAG: [phone] (ICD-10-CM) - Radiculopathy, cervical region  THERAPY DIAG:  Radiculopathy, cervical region  Abnormal posture  Muscle weakness (generalized)  Rationale for Evaluation and Treatment: Rehabilitation  ONSET DATE: 3 weeks ago  SUBJECTIVE:  SUBJECTIVE STATEMENT: Patient presents with tingling in his right shoulder but no pain. He has not been doing his exercises as much due to getting a cortisone injection in his shoulder a week ago.    From Eval: Patient presents with numbness in his right hand digits 3,4,5. The numbness traveled up his right arm into his shoulder. His MD put him on prednisone  and ibuprofen and he had noted minimal relief. He has noted mild changes in his grip strength and over head strength. His tingling increases when he reaches overhead  Hand dominance: Right  PERTINENT HISTORY:  OA; DDD; patient reports having a fracture of his cervical vertebra   PAIN:  Are you having pain? Yes: NPRS scale: 3(currently) 8(worst)/10 Pain location: right hand into the shoulder Pain description: dull; numbness is constant Aggravating factors: reaching over head, playing tennis (instant pain) ; washing his hair; putting on shirt Relieving factors: relaxing his arm down  PRECAUTIONS: None  RED FLAGS: None     WEIGHT BEARING RESTRICTIONS: No  FALLS:  Has patient fallen in last 6 months? No  LIVING ENVIRONMENT: Lives with: lives with their family Lives in: House/apartment Stairs: Yes: Internal: 14 steps; on left going up  and External: 8 steps; can reach both  OCCUPATION: Retired but about to start renovating a house   PLOF: Independent, Independent with basic ADLs, Independent with gait, Independent with transfers, and Leisure: play tennis & play music (mandolin)   PATIENT GOALS: To get rid of the problem  NEXT MD VISIT: Next week  OBJECTIVE:  Note: Objective measures were completed at Evaluation unless otherwise noted.  DIAGNOSTIC FINDINGS:  Xrays show C5-6 disc space narrowing and degenerative spurring   PATIENT SURVEYS:  Quick Dash 59.1/100  COGNITION: Overall cognitive status: Within functional limits for tasks assessed  SENSATION: Constant numbness in right hand   POSTURE: rounded shoulders and forward head  PALPATION: Increased muscle spasms right upper trap Decreased Grade II joint mobility throughout C-spine   CERVICAL ROM:   Active ROM A/PROM (deg) eval  Flexion 35  Extension 50  Right lateral flexion 20  Left lateral flexion 32  Right rotation 60  Left rotation 65   (Blank rows = not tested)  UPPER EXTREMITY ROM: WFL bilateral    UPPER EXTREMITY MMT: 5/5 bilateral increase in tingling in right digits (3,4,5 with testing)  MMT Right eval Left eval  Shoulder flexion    Shoulder extension    Shoulder abduction    Shoulder adduction    Shoulder extension    Shoulder internal rotation    Shoulder external rotation    Middle trapezius    Lower trapezius    Elbow flexion    Elbow extension    Wrist flexion    Wrist extension    Wrist ulnar deviation    Wrist radial deviation    Wrist pronation    Wrist supination    Grip strength 70 72   (Blank rows = not tested)  CERVICAL SPECIAL TESTS:  Distraction test: No change in tingling   FUNCTIONAL TESTS:  5 times sit to stand: 9.65 sec no UE support Timed up and go (TUG): 6.60 sec  TREATMENT DATE:  08/21/2024 UBE 3/3 Level 2 - PT presents to discuss status Seated Upper trap stretch 2 x 20 sec Ulnar nerve  glide x 5 Shoulder ER with green TB x 10 Hooklying chin tuck 2 x 10 Hooklying scap squeeze  x 10 Hooklying shoulder extension against mat table x 10 Hooklying  shoulder abduction  Cervical Melt Method (flexion/extension and rotation) x 20 each direction Standing shoulder flexion 0# x 10 (increased tingling)  Review and updated of HEP Cervical Mechanical traction 17 lbs of force x 15 mins   08/08/2024 Initial Evaluation & HEP created                                                                                                                              Benefits of heat/ice Balance of working on neutral mobility while trying not to aggravate inflammation   PATIENT EDUCATION:  Education details: PT eval findings, anticipated POC, progress with PT, and initial HEP Person educated: Patient Education method: Explanation, Demonstration, and Handouts Education comprehension: verbalized understanding, returned demonstration, and needs further education  HOME EXERCISE PROGRAM: Access Code: [email or web address] URL: [email or web address] Date: 08/21/2024 Prepared by: Kristeen Sar  Exercises - Ulnar Nerve Glide- Full Arm  - 1 x daily - 7 x weekly - 1 sets - 10 reps - Shoulder External Rotation and Scapular Retraction with Resistance  - 1 x daily - 7 x weekly - 1 sets - 10 reps - Seated Upper Trapezius Stretch  - 1 x daily - 7 x weekly - 1-2 sets - 15-20s hold - Supine Cervical Retraction with Towel  - 1 x daily - 7 x weekly - 2 sets - 10 reps - Supine Shoulder Horizontal Abduction with Resistance  - 1 x daily - 7 x weekly - 2 sets - 10 reps  ASSESSMENT:  CLINICAL IMPRESSION: Jaivian presents to first follow up appointment since evaluation. He verbalized semi-compliance with HEP. Reviewed exercises and provided cues as needed for proper performance. He got a steroid shot in his right shoulder a week ago and he noted minimal improvements. Introduced gentle postural strengthening today and  added exercises to HEP. Will assess patient's response to cervical traction.    OBJECTIVE IMPAIRMENTS: decreased ROM, increased muscle spasms, impaired sensation, impaired UE functional use, postural dysfunction, and pain.   ACTIVITY LIMITATIONS: carrying, lifting, bathing, dressing, reach over head, and hygiene/grooming  PARTICIPATION LIMITATIONS: cleaning, laundry, driving, community activity, and playing tennis & the mandolin  PERSONAL FACTORS: Age, Fitness, Time since onset of injury/illness/exacerbation, and 1-2 comorbidities: OA and DDD are also affecting patient's functional outcome.   REHAB POTENTIAL: Good  CLINICAL DECISION MAKING: Evolving/moderate complexity  EVALUATION COMPLEXITY: Moderate   GOALS: Goals reviewed with patient? Yes  SHORT TERM GOALS: Target date: 09/05/2024  Patient will be independent with initial HEP. Baseline:  Goal status: INITIAL  2.  Patient will report > or = to 30% improvement in functional use of right UE since starting PT. Baseline:  Goal status: INITIAL   LONG TERM GOALS: Target date: 10/03/2024  Patient will demonstrate independence in advanced HEP. Baseline:  Goal status: INITIAL  2.  Patient will report > or = to 50% improvement in right hand numbness since starting PT. Baseline:  Goal status: INITIAL  3.  Patient will be  able to play tennis with no difficulty to maintain cardiovascular health. Baseline: unable Goal status: INITIAL   4.  Patient will be able to play the mandolin with minimal difficulty due to finger numbness.  Baseline:  Goal status: INITIAL  5.  Patient will score > or = to 70/100 on QUICKDASH due to improved UE use and decreased disability.  Baseline:  Goal status: INITIAL    PLAN:  PT FREQUENCY: 2x/week  PT DURATION: 8 weeks  PLANNED INTERVENTIONS: 97164- PT Re-evaluation, 97110-Therapeutic exercises, 97530- Therapeutic activity, 97112- Neuromuscular re-education, 97535- Self Care, 02859-  Manual therapy, [phone]- Canalith repositioning, [id number]- Aquatic Therapy, [phone]- Electrical stimulation (unattended), [phone]- Electrical stimulation (manual), [id number]- Vasopneumatic device, [id number]- Ultrasound, [id number]- Traction (mechanical), [id number]- Ionotophoresis 4mg /ml Dexamethasone , 79439 (1-2 muscles), 20561 (3+ muscles)- Dry Needling, Patient/Family education, Balance training, Joint mobilization, Joint manipulation, Spinal manipulation, Spinal mobilization, Vestibular training, Cryotherapy, and Moist heat  PLAN FOR NEXT SESSION:assess patient's response to traction; HEP compliance;  manual as indicated    Kristeen Sar, PT, DPT 08/21/24 9:08 AM [facility] [address], [address] [place], [place] [address] Phone # [phone] Fax [phone]  PHYSICAL THERAPY DISCHARGE SUMMARY  Visits from Start of Care: 2  Current functional level related to goals / functional outcomes: Patient only had two visits in POC. He went to Dr. Harvey and he said that he did not need to continue rehab for his neck.   Remaining deficits: Cervical radicular symptoms    Education / Equipment: See above   Patient agrees to discharge. Patient goals were not met. Patient is being discharged due to Patient called to report Dr. Harvey said he did not need to continue rehab.

## 2024-08-23 ENCOUNTER — Ambulatory Visit: Admitting: Sports Medicine

## 2024-08-23 ENCOUNTER — Encounter: Payer: Self-pay | Admitting: Sports Medicine

## 2024-08-23 VITALS — BP 104/64 | Ht 67.0 in | Wt 140.0 lb

## 2024-08-23 DIAGNOSIS — Z85828 Personal history of other malignant neoplasm of skin: Secondary | ICD-10-CM | POA: Diagnosis not present

## 2024-08-23 DIAGNOSIS — L814 Other melanin hyperpigmentation: Secondary | ICD-10-CM | POA: Diagnosis not present

## 2024-08-23 DIAGNOSIS — M5010 Cervical disc disorder with radiculopathy, unspecified cervical region: Secondary | ICD-10-CM

## 2024-08-23 DIAGNOSIS — D1801 Hemangioma of skin and subcutaneous tissue: Secondary | ICD-10-CM | POA: Diagnosis not present

## 2024-08-23 DIAGNOSIS — Z08 Encounter for follow-up examination after completed treatment for malignant neoplasm: Secondary | ICD-10-CM | POA: Diagnosis not present

## 2024-08-23 DIAGNOSIS — L821 Other seborrheic keratosis: Secondary | ICD-10-CM | POA: Diagnosis not present

## 2024-08-23 NOTE — Progress Notes (Signed)
 Established Patient Office Visit  PCP: Kip Righter, MD  Patient is a 68 y.o. male here for right hand numbness/tingling x 1 month.  He describes paresthesias in digits 4-5 of the right hand.  Symptoms radiate proximally to the right shoulder.  It is affecting his ability to play tennis as symptoms are exacerbated by overhead strokes and serving.  He has been evaluated at [facility] with question for cervical vs shoulder pathology.  X-rays of the cervical spine have shown degenerative changes at C5-6.  X-rays of the right shoulder have shown arthritic changes at the [facility] and glenohumeral joints.  He has tried brief courses of oral steroids, which have provided symptom relief that abruptly returned upon completion.  He also received a glenohumeral corticosteroid injection that did not significantly alleviate his symptoms.  Most recently he has started physical therapy and would like for us  to review his exercise program today.  Past Medical History:  Diagnosis Date   Arthritis    BPH (benign prostatic hypertrophy)    Cervical spondylosis    Incomplete emptying of bladder    Lumbar stenosis    Nocturia     Current Outpatient Medications on File Prior to Visit  Medication Sig Dispense Refill   gabapentin  (NEURONTIN ) 600 MG tablet Take 1 tablet (600 mg total) by mouth 3 (three) times daily. 180 tablet 2   methylPREDNISolone  (MEDROL  DOSEPAK) 4 MG TBPK tablet Take as directed 21 tablet 1   Multiple Vitamins-Minerals (MENS 50+ MULTI VITAMIN/MIN PO) Take 1 tablet by mouth daily.     predniSONE  (DELTASONE ) 20 MG tablet Take 1 tablet (20 mg total) by mouth 2 (two) times daily. 14 tablet 0   predniSONE  (STERAPRED UNI-PAK 21 TAB) 10 MG (21) TBPK tablet Take as directed 21 tablet 3   No current facility-administered medications on file prior to visit.    Past Surgical History:  Procedure Laterality Date   colonoscopy     COLONOSCOPY WITH PROPOFOL  N/A 09/16/2015   Procedure: COLONOSCOPY WITH  PROPOFOL ;  Surgeon: Gladis MARLA Louder, MD;  Location: [facility];  Service: Endoscopy;  Laterality: N/A;   GREEN LIGHT LASER TURP (TRANSURETHRAL RESECTION OF PROSTATE N/A 11/20/2013   Procedure: GREEN LIGHT LASER TURP (TRANSURETHRAL RESECTION OF PROSTATE;  Surgeon: Donnice Gwenyth Brooks, MD;  Location[facility];  Service: Urology;  Laterality: N/A;   INGUINAL HERNIA REPAIR  AGE 72    No Known Allergies  BP 104/64   Ht 5' 7 (1.702 m)   Wt 140 lb (63.5 kg)   BMI 21.93 kg/m       No data to display              No data to display              Objective:  Physical Exam:  Gen: NAD, comfortable in exam room  Neck No gross deformity, swelling, bruising. No midline/bony TTP. FROM. BUE strength 5/5.   Sensation intact to light touch.   Absent triceps reflexes bilaterally 2+ equal reflexes in biceps and brachioradialis tendons. Positive Spurling's NV intact distal BUEs.   Right shoulder No swelling, ecchymoses.  No gross deformity. No TTP. FROM. Negative Hawkins, Neers. Negative Yergasons. Strength 5/5 with empty can and resisted internal/external rotation. Negative apprehension. NV intact distally.   Assessment and Plan:   1.  Cervical radiculopathy, right  His history and exam findings today suggest a cervical etiology to explain his symptoms.  Question foraminal stenosis vs disc herniation.  Degenerative  changes noted on x-rays of the cervical spine do not correlate with his symptoms. Recommend proceeding with MRI of the cervical spine for further evaluation.  He should continue gabapentin  and can add vitamin B6 to help with nerve healing.  He can also add Aleve twice daily.  Recommend performing cervical traction and isometric exercises in the interim.  Further management to be determined based on MRI results.

## 2024-08-24 ENCOUNTER — Ambulatory Visit: Admitting: Physical Therapy

## 2024-08-28 ENCOUNTER — Ambulatory Visit: Admitting: Physical Therapy

## 2024-08-31 ENCOUNTER — Ambulatory Visit: Admitting: Physical Therapy

## 2024-09-04 ENCOUNTER — Ambulatory Visit: Admitting: Orthopaedic Surgery

## 2024-09-04 ENCOUNTER — Ambulatory Visit: Admitting: Physical Therapy

## 2024-09-05 ENCOUNTER — Ambulatory Visit
Admission: RE | Admit: 2024-09-05 | Discharge: 2024-09-05 | Disposition: A | Discharge status: Home / Self Care | Source: Ambulatory Visit | Attending: Internal Medicine | Admitting: Internal Medicine

## 2024-09-05 DIAGNOSIS — M50121 Cervical disc disorder at C4-C5 level with radiculopathy: Secondary | ICD-10-CM | POA: Diagnosis not present

## 2024-09-05 DIAGNOSIS — M5013 Cervical disc disorder with radiculopathy, cervicothoracic region: Secondary | ICD-10-CM | POA: Diagnosis not present

## 2024-09-05 DIAGNOSIS — M4722 Other spondylosis with radiculopathy, cervical region: Secondary | ICD-10-CM | POA: Diagnosis not present

## 2024-09-05 DIAGNOSIS — M5010 Cervical disc disorder with radiculopathy, unspecified cervical region: Secondary | ICD-10-CM

## 2024-09-05 DIAGNOSIS — M50122 Cervical disc disorder at C5-C6 level with radiculopathy: Secondary | ICD-10-CM | POA: Diagnosis not present

## 2024-09-05 DIAGNOSIS — M5011 Cervical disc disorder with radiculopathy,  high cervical region: Secondary | ICD-10-CM | POA: Diagnosis not present

## 2024-09-05 MED ORDER — GADOPICLENOL 0.5 MMOL/ML IV SOLN
6.0000 mL | Freq: Once | INTRAVENOUS | Status: AC | PRN
  Administered 2024-09-05: 6 mL via INTRAVENOUS

## 2024-09-11 ENCOUNTER — Ambulatory Visit: Admitting: Physical Therapy

## 2024-09-13 ENCOUNTER — Ambulatory Visit: Admitting: Physical Therapy

## 2024-09-18 ENCOUNTER — Ambulatory Visit: Admitting: Physical Therapy

## 2024-09-20 ENCOUNTER — Ambulatory Visit: Admitting: Physical Therapy

## 2024-10-02 ENCOUNTER — Other Ambulatory Visit: Payer: Self-pay | Admitting: Sports Medicine

## 2024-10-18 ENCOUNTER — Telehealth: Admitting: Sports Medicine

## 2024-10-18 VITALS — Ht 67.0 in

## 2024-10-18 DIAGNOSIS — M5412 Radiculopathy, cervical region: Secondary | ICD-10-CM

## 2024-10-18 MED ORDER — GABAPENTIN 600 MG PO TABS: 600.0000 mg | ORAL_TABLET | Freq: Four times a day (QID) | ORAL | 2 refills | Status: AC

## 2024-10-18 MED ORDER — GABAPENTIN 600 MG PO TABS: 600.0000 mg | ORAL_TABLET | Freq: Four times a day (QID) | ORAL | 2 refills | Status: DC

## 2024-10-18 NOTE — Progress Notes (Signed)
 I chief complaint follow-up of cervical radiculopathy  Patient agrees to a telephone visit today as he feels that he is doing better and does not need a repeat examination.  The patient is in his home.  My visit was conducted from my office in the [facility] medicine center  Patient was first seen in the orthopedic office for cervical radiculopathy starting in October.  We first saw him on November 13 and thought he had a cervical disc disorder with radiculopathy.  MRI was conducted and showed multi level cervical spondylolysis with mild to moderate diffuse spinal stenosis in the mid cervical spine most pronounced at C5-6.  He had other areas of degenerative change including foraminal narrowings and even some severe foraminal stenosis at T2-some cord change was noted at C5-c6  Because he could tolerate his symptoms pretty well we modified his activity level.  We gave him posture exercises and strengthening for his cervical muscles.  We had him maintain his shoulder and rotator cuff strength.  We had him modify the amount of tennis he was doing and cut out serving and overhead's.  We increased his gabapentin  to 604 times a day.  Within 2 weeks of these changes he started making some significant improvement.  He notes now that he is 6 weeks out that he has no pain.  Most of the numbness and tingling in his fingers has resolved.  He has had none of the sharp pains return.  He attributes this primarily to time but also thinks some of the impact started with the increased dose of gabapentin .  Impression cervical radiculopathy improved significantly with conservative care and high-dose gabapentin   Plan I told him that as long as his pain was absent and he was making good progress that he can go back to increasing his tennis activities as tolerated.  I did encourage him to continue to work carefully on posture.  Maintain his cervical muscle strength primarily with isometric exercises.  Maintain his rotator cuff  strength.  Look to any biomechanical issues in his tennis game and if any of the strokes or positions are bothering him he should modify these.  He was in agreement with this plan and will see me as needed

## 2024-10-18 NOTE — Assessment & Plan Note (Signed)
 See his visit note as he has made significant improvement.  He can resume his normal activities as to tolerance.  We will keep him at gabapentin  4 times a day over the next 2 months but after that may start weaning him down to a lower dose.
